# Patient Record
Sex: Male | Born: 1985 | Hispanic: Yes | Marital: Married | Smoking: Former smoker
Health system: Southern US, Community
[De-identification: ages and names within clinical notes are randomized; demographics above are authoritative.]

## PROBLEM LIST (undated history)

## (undated) DIAGNOSIS — E860 Dehydration: Secondary | ICD-10-CM

## (undated) HISTORY — PX: NO PAST SURGERIES: SHX2092

---

## 2011-09-19 ENCOUNTER — Encounter (HOSPITAL_COMMUNITY): Payer: Self-pay | Admitting: Emergency Medicine

## 2011-09-19 ENCOUNTER — Inpatient Hospital Stay (HOSPITAL_COMMUNITY)
Admission: EM | Admit: 2011-09-19 | Discharge: 2011-09-21 | DRG: 639 | Disposition: A | Discharge status: Home / Self Care | Payer: Self-pay | Attending: Internal Medicine | Admitting: Internal Medicine

## 2011-09-19 ENCOUNTER — Other Ambulatory Visit: Payer: Self-pay

## 2011-09-19 DIAGNOSIS — E119 Type 2 diabetes mellitus without complications: Secondary | ICD-10-CM | POA: Diagnosis present

## 2011-09-19 DIAGNOSIS — E872 Acidosis: Secondary | ICD-10-CM | POA: Diagnosis present

## 2011-09-19 DIAGNOSIS — E1010 Type 1 diabetes mellitus with ketoacidosis without coma: Principal | ICD-10-CM | POA: Diagnosis present

## 2011-09-19 DIAGNOSIS — E860 Dehydration: Secondary | ICD-10-CM | POA: Diagnosis present

## 2011-09-19 DIAGNOSIS — E785 Hyperlipidemia, unspecified: Secondary | ICD-10-CM | POA: Diagnosis present

## 2011-09-19 DIAGNOSIS — E1110 Type 2 diabetes mellitus with ketoacidosis without coma: Secondary | ICD-10-CM | POA: Diagnosis present

## 2011-09-19 DIAGNOSIS — E876 Hypokalemia: Secondary | ICD-10-CM | POA: Diagnosis present

## 2011-09-19 HISTORY — DX: Dehydration: E86.0

## 2011-09-19 LAB — BASIC METABOLIC PANEL
BUN: 7 mg/dL (ref 6–23)
BUN: 6 mg/dL (ref 6–23)
BUN: 8 mg/dL (ref 6–23)
CO2: 10 mEq/L — CL (ref 19–32)
CO2: 15 mEq/L — ABNORMAL LOW (ref 19–32)
CO2: 15 mEq/L — ABNORMAL LOW (ref 19–32)
CO2: 16 mEq/L — ABNORMAL LOW (ref 19–32)
Calcium: 8.4 mg/dL (ref 8.4–10.5)
Calcium: 9 mg/dL (ref 8.4–10.5)
Calcium: 9 mg/dL (ref 8.4–10.5)
Chloride: 104 mEq/L (ref 96–112)
Chloride: 113 mEq/L — ABNORMAL HIGH (ref 96–112)
Chloride: 110 mEq/L (ref 96–112)
Chloride: 110 mEq/L (ref 96–112)
Creatinine, Ser: 0.75 mg/dL (ref 0.50–1.35)
Creatinine, Ser: 0.68 mg/dL (ref 0.50–1.35)
GFR calc Af Amer: >90 mL/min (ref >90)
GFR calc Af Amer: >90 mL/min (ref >90)
GFR calc Af Amer: >90 mL/min (ref >90)
GFR calc Af Amer: >90 mL/min (ref >90)
GFR calc non Af Amer: >90 mL/min (ref >90)
GFR calc non Af Amer: >90 mL/min (ref >90)
GFR calc non Af Amer: >90 mL/min (ref >90)
GFR calc non Af Amer: >90 mL/min (ref >90)
Glucose, Bld: 334 mg/dL — ABNORMAL HIGH (ref 70–99)
Glucose, Bld: 208 mg/dL — ABNORMAL HIGH (ref 70–99)
Potassium: 3.5 mEq/L (ref 3.5–5.1)
Potassium: 3.6 mEq/L (ref 3.5–5.1)
Potassium: 3.2 mEq/L — ABNORMAL LOW (ref 3.5–5.1)
Potassium: 3 mEq/L — ABNORMAL LOW (ref 3.5–5.1)
Sodium: 143 mEq/L (ref 135–145)
Sodium: 145 mEq/L (ref 135–145)
Sodium: 146 mEq/L — ABNORMAL HIGH (ref 135–145)
Sodium: 142 mEq/L (ref 135–145)

## 2011-09-19 LAB — DIFFERENTIAL
Lymphocytes Relative: 18 % (ref 12–46)
Monocytes Absolute: 0.3 10*3/uL (ref 0.1–1.0)
Monocytes Relative: 6 % (ref 3–12)
Neutro Abs: 4.3 10*3/uL (ref 1.7–7.7)

## 2011-09-19 LAB — URINALYSIS, ROUTINE W REFLEX MICROSCOPIC
Glucose, UA: >1000 mg/dL — AB
Leukocytes, UA: NEGATIVE
Nitrite: NEGATIVE
Specific Gravity, Urine: 1.04 — ABNORMAL HIGH (ref 1.005–1.030)
pH: 5.5 (ref 5.0–8.0)

## 2011-09-19 LAB — CBC
HCT: 44.8 % (ref 39.0–52.0)
Hemoglobin: 15 g/dL (ref 13.0–17.0)
MCH: 27 pg (ref 26.0–34.0)
MCHC: 33.7 g/dL (ref 30.0–36.0)
Platelets: 274 10*3/uL (ref 150–400)
RBC: 5.08 MIL/uL (ref 4.22–5.81)
WBC: 5.7 10*3/uL (ref 4.0–10.5)
WBC: 7.7 10*3/uL (ref 4.0–10.5)

## 2011-09-19 LAB — POCT I-STAT 3, ART BLOOD GAS (G3+)
Bicarbonate: 8.6 mEq/L — ABNORMAL LOW (ref 20.0–24.0)
TCO2: 9 mmol/L (ref 0–100)
pCO2 arterial: 22.2 mmHg — ABNORMAL LOW (ref 35.0–45.0)
pH, Arterial: 7.195 — CL (ref 7.350–7.450)

## 2011-09-19 LAB — GLUCOSE, CAPILLARY
Glucose-Capillary: 343 mg/dL — ABNORMAL HIGH (ref 70–99)
Glucose-Capillary: 227 mg/dL — ABNORMAL HIGH (ref 70–99)
Glucose-Capillary: 213 mg/dL — ABNORMAL HIGH (ref 70–99)
Glucose-Capillary: 184 mg/dL — ABNORMAL HIGH (ref 70–99)
Glucose-Capillary: 251 mg/dL — ABNORMAL HIGH (ref 70–99)
Glucose-Capillary: 130 mg/dL — ABNORMAL HIGH (ref 70–99)

## 2011-09-19 LAB — URINE MICROSCOPIC-ADD ON

## 2011-09-19 LAB — MRSA PCR SCREENING: MRSA by PCR: NEGATIVE

## 2011-09-19 MED ORDER — ENOXAPARIN SODIUM 40 MG/0.4ML ~~LOC~~ SOLN
40.0000 mg | SUBCUTANEOUS | Status: DC
  Administered 2011-09-19 – 2011-09-20 (×2): 40 mg via SUBCUTANEOUS
  Filled 2011-09-19 (×3): qty 0.4

## 2011-09-19 MED ORDER — SODIUM CHLORIDE 0.9 % IV SOLN: INTRAVENOUS | Status: DC

## 2011-09-19 MED ORDER — SODIUM CHLORIDE 0.9 % IV SOLN
INTRAVENOUS | Status: DC
  Administered 2011-09-19: 12:00:00 via INTRAVENOUS

## 2011-09-19 MED ORDER — POTASSIUM CHLORIDE 10 MEQ/100ML IV SOLN
10.0000 meq | INTRAVENOUS | Status: DC
  Filled 2011-09-19: qty 100

## 2011-09-19 MED ORDER — DEXTROSE-NACL 5-0.45 % IV SOLN
INTRAVENOUS | Status: DC
  Administered 2011-09-19: 14:00:00 via INTRAVENOUS

## 2011-09-19 MED ORDER — SODIUM CHLORIDE 0.9 % IV SOLN
999.0000 mL | INTRAVENOUS | Status: DC
  Administered 2011-09-19: 999 mL via INTRAVENOUS

## 2011-09-19 MED ORDER — PNEUMOCOCCAL VAC POLYVALENT 25 MCG/0.5ML IJ INJ
0.5000 mL | INJECTION | INTRAMUSCULAR | Status: AC
  Administered 2011-09-20: 0.5 mL via INTRAMUSCULAR
  Filled 2011-09-19: qty 0.5

## 2011-09-19 MED ORDER — SODIUM CHLORIDE 0.9 % IV SOLN
INTRAVENOUS | Status: DC
  Administered 2011-09-19: 2.4 [IU]/h via INTRAVENOUS
  Filled 2011-09-19: qty 1

## 2011-09-19 MED ORDER — POTASSIUM CHLORIDE 10 MEQ/100ML IV SOLN
10.0000 meq | INTRAVENOUS | Status: AC
  Administered 2011-09-19 (×2): 10 meq via INTRAVENOUS
  Filled 2011-09-19 (×2): qty 100

## 2011-09-19 MED ORDER — DEXTROSE 50 % IV SOLN: 25.0000 mL | INTRAVENOUS | Status: DC | PRN

## 2011-09-19 MED ORDER — DEXTROSE-NACL 5-0.45 % IV SOLN
INTRAVENOUS | Status: DC
  Administered 2011-09-19 – 2011-09-20 (×2): via INTRAVENOUS

## 2011-09-19 MED ORDER — SODIUM CHLORIDE 0.9 % IV SOLN
INTRAVENOUS | Status: DC
  Administered 2011-09-19: 1.5 [IU]/h via INTRAVENOUS
  Filled 2011-09-19: qty 1

## 2011-09-19 NOTE — Progress Notes (Signed)
I have made a referral to [facility] Johney Frame so patient may obtain some sort of health resources in the community.

## 2011-09-19 NOTE — ED Provider Notes (Signed)
History     CSN: [id number]  Arrival date & time 09/19/11  0865   First MD Initiated Contact with Patient 09/19/11 [id number]      Chief Complaint  Patient presents with  . Dehydration    (Consider location/radiation/quality/duration/timing/severity/associated sxs/prior treatment) HPI History provided by pt.   Pt c/o generalized weakness and lightheadedness x 1 month.  Sx gradually worsening.  Associated w/ subjective weight loss, polyuria, polydipsia, blurred vision and unsteadiness on feet.  Denies fever, headache, paresthesias, cough, abd pain, vomiting, diarrhea or other urinary sx.  Denies head trauma.  Pt has no PMH.    History reviewed. No pertinent past medical history.  History reviewed. No pertinent past surgical history.  History reviewed. No pertinent family history.  History  Substance Use Topics  . Smoking status: Former Smoker    Quit date: 08/15/2011  . Smokeless tobacco: Not on file  . Alcohol Use: Yes     3 drinks week      Review of Systems  All other systems reviewed and are negative.    Allergies  Review of patient's allergies indicates not on file.  Home Medications  No current outpatient prescriptions on file.  BP 138/86  Pulse 97  Temp(Src) 97.7 F (36.5 C) (Oral)  Resp 18  SpO2 100%  Physical Exam  Nursing note and vitals reviewed. Constitutional: He is oriented to person, place, and time. He appears well-developed and well-nourished. No distress.  HENT:  Head: Normocephalic and atraumatic.       Dry mucous membranes  Eyes:       Normal appearance  Neck: Normal range of motion.  Cardiovascular: Normal rate and regular rhythm.   Pulmonary/Chest: Effort normal and breath sounds normal.  Abdominal: Soft. Bowel sounds are normal. He exhibits no distension and no mass. There is no tenderness. There is no rebound and no guarding.       No CVA tenderness  Musculoskeletal:       No peripheral edema or calf ttp  Neurological: He is alert  and oriented to person, place, and time.       5/5 and equal upper and lower extremity strength.  No sensory deficits.  No past pointing.  Nml gait.    Skin: Skin is warm and dry. No rash noted.  Psychiatric: He has a normal mood and affect. His behavior is normal.    ED Course  Procedures (including critical care time)   Date: 09/19/2011  Rate: 98  Rhythm: normal sinus rhythm  QRS Axis: normal  Intervals: normal  ST/T Wave abnormalities: normal  Conduction Disutrbances:none  Narrative Interpretation:   Old EKG Reviewed: none available   Labs Reviewed  URINALYSIS, ROUTINE W REFLEX MICROSCOPIC - Abnormal; Notable for the following:    Specific Gravity, Urine 1.040 (*)    Glucose, UA >1000 (*)    Hgb urine dipstick MODERATE (*)    Ketones, ur >80 (*)    Protein, ur 100 (*)    All other components within normal limits  BASIC METABOLIC PANEL - Abnormal; Notable for the following:    CO2 10 (*)    Glucose, Bld 416 (*)    All other components within normal limits  URINE MICROSCOPIC-ADD ON - Abnormal; Notable for the following:    Casts GRANULAR CAST (*)    All other components within normal limits  GLUCOSE, CAPILLARY - Abnormal; Notable for the following:    Glucose-Capillary 441 (*)    All other components within normal limits  GLUCOSE, CAPILLARY - Abnormal; Notable for the following:    Glucose-Capillary 343 (*)    All other components within normal limits  BASIC METABOLIC PANEL - Abnormal; Notable for the following:    CO2 11 (*)    Glucose, Bld 334 (*)    All other components within normal limits  GLUCOSE, CAPILLARY - Abnormal; Notable for the following:    Glucose-Capillary 302 (*)    All other components within normal limits  GLUCOSE, CAPILLARY - Abnormal; Notable for the following:    Glucose-Capillary 309 (*)    All other components within normal limits  CBC  DIFFERENTIAL  BASIC METABOLIC PANEL  BASIC METABOLIC PANEL  BASIC METABOLIC PANEL  BLOOD GAS,  ARTERIAL   No results found.   1. DKA (diabetic ketoacidoses)       MDM  Previously healthy 26yo M presents w/ generalized weakness, lightheadedness, subjective weight loss, polyuria, polydipsia and blurred vision x 1 month.  On exam, afebrile, NAD, dehydrated, lungs clear, abd benign/non-tender.  Cbg 441.  Pt receiving liter bolus and will then be placed on glucostabilizer.  Rest of labs pending.  10:19 AM   BG has improved to 343.  Labs otherwise sig for bicarb of 10 and anion gap of 28.  All results as well as plan to admit for further tmnt discussed w/ pt.  Consulted critical care since ICU currently closed and they requested [facility] admission.  [facility] has accepted pt to their service.          Arie Sabina [place], [place] 09/19/11 1319

## 2011-09-19 NOTE — ED Notes (Signed)
Pt c/o excessive thirst, urination, weighloss significant, dizziness for about the last month. Denies HA. C/o changes in gait, blurred vision.

## 2011-09-19 NOTE — H&P (Signed)
PATIENT DETAILS Name: Kyle Rubio Age: 26 y.o. Sex: male Date of Birth: [birth year] Admit Date: 09/19/2011 PCP:Pcp Not In System   CHIEF COMPLAINT:  Generalized weakness, polyuria and polydipsia for one month  HPI: Patient is a 26 year old male with no significant past medical history who presents to the ED for evaluation of the above noted complaints. The patient he was in his usual state of health around a month ago when he started developing excessive thirst and frequent urination. He claims that he has to wake up around 4 times at night to urinate. He also claims of excessive thirst and claims to drink a large amount of water. He claims that he has had a vision for the past few weeks as well. He claims to be generally weak and achy all the time. In the ED he was found to have diabetic ketoacidosis and the hospitalist service was then asked to admit him for further evaluation and treatment. Patient denies any headache, denies any fever. He denies any nausea, vomiting or diarrhea. He denies any abdominal pain or dysuria.   ALLERGIES:  No Known Allergies  PAST MEDICAL HISTORY: Past Medical History  Diagnosis Date  . Dehydration   . Diabetes mellitus     new onset    PAST SURGICAL HISTORY: Past Surgical History  Procedure Date  . No past surgeries     MEDICATIONS AT HOME: Prior to Admission medications   Not on File    FAMILY HISTORY: History reviewed. No pertinent family history.  SOCIAL HISTORY:  reports that he quit smoking about 5 weeks ago. He has never used smokeless tobacco. He reports that he drinks alcohol. He reports that he does not use illicit drugs.  REVIEW OF SYSTEMS:  Constitutional:   No  weight loss, night sweats,  Fevers, chills, fatigue.  HEENT:    No headaches, Difficulty swallowing,Tooth/dental problems,Sore throat,  No sneezing, itching, ear ache, nasal congestion, post nasal drip,   Cardio-vascular: No chest pain,  Orthopnea,  PND, swelling in lower extremities, anasarca, dizziness, palpitations  GI:  No heartburn, indigestion, abdominal pain, nausea, vomiting, diarrhea, change in       bowel habits, loss of appetite  Resp: No shortness of breath with exertion or at rest.  No excess mucus, no productive cough, No non-productive cough,  No coughing up of blood.No change in color of mucus.No wheezing.No chest wall deformity  Skin:  no rash or lesions.  GU:  no dysuria, change in color of urine, no urgency or frequency.  No flank pain.  Musculoskeletal: No joint pain or swelling.  No decreased range of motion.  No back pain.  Psych: No change in mood or affect. No depression or anxiety.  No memory loss.   PHYSICAL EXAM: Blood pressure 127/72, pulse 74, temperature 98.4 F (36.9 C), temperature source Oral, resp. rate 16, SpO2 100.00%.  General appearance :Awake, alert, not in any distress. Speech Clear. Not toxic Looking HEENT: Atraumatic and Normocephalic, pupils equally reactive to light and accomodation Neck: supple, no JVD. No cervical lymphadenopathy.  Chest:Good air entry bilaterally, no added sounds  CVS: S1 S2 regular, no murmurs.  Abdomen: Bowel sounds present, Non tender and not distended with no gaurding, rigidity or rebound. Extremities: B/L Lower Ext shows no edema, both legs are warm to touch, with  dorsalis pedis pulses palpable. Neurology: Awake alert, and oriented X 3, CN II-XII intact, Non focal, Deep Tendon Reflex-2+ all over, plantar's downgoing B/L, sensory exam is grossly intact.  Skin:No Rash  Wounds:N/A  LABS ON ADMISSION:   Basename 09/19/11 1601 09/19/11 1600  NA 146* 145  K 3.6 3.7  CL 114* 113*  CO2 15* 15*  GLUCOSE 208* 200*  BUN 6 6  CREATININE 0.71 0.71  CALCIUM 9.1 9.0  MG -- --  PHOS -- --   No results found for this basename: AST:2,ALT:2,ALKPHOS:2,BILITOT:2,PROT:2,ALBUMIN:2 in the last 72 hours No results found for this basename: LIPASE:2,AMYLASE:2 in the  last 72 hours  Basename 09/19/11 1600 09/19/11 0927  WBC 7.7 5.7  NEUTROABS -- 4.3  HGB 13.7 15.0  HCT 40.6 44.8  MCV 79.9 80.4  PLT 274 275   No results found for this basename: CKTOTAL:3,CKMB:3,CKMBINDEX:3,TROPONINI:3 in the last 72 hours No results found for this basename: DDIMER:2 in the last 72 hours No components found with this basename: POCBNP:3   RADIOLOGIC STUDIES ON ADMISSION: No results found.  ASSESSMENT AND PLAN: Present on Admission:   Diabetic ketoacidosis -We'll admit to a step down unit for now -We'll aggressively hydrate and place him on insulin infusion per glucose stabilizer protocol -He would likely need to be transitioned to subcutaneous insulin, will obtain HbA1c as well. -Patient will need extensive diabetic education and teaching prior to discharge.  Further plan will depend as patient's clinical course evolves and further radiologic and laboratory data become available. Patient will be monitored closely.  DVT Prophylaxis: Lovenox  Code Status: Full code  Total time spent for admission equals 45 minutes.  Jeoffrey Massed 09/19/2011, 6:14 PM

## 2011-09-19 NOTE — Progress Notes (Signed)
Utilization Review Completed.Remmie Bembenek T3/27/2013   

## 2011-09-19 NOTE — ED Notes (Signed)
Respiratory called regarding ABG results. States that they will call this RN back with results

## 2011-09-19 NOTE — ED Notes (Addendum)
CBG 441, Dr. Richrd Prime made aware.

## 2011-09-19 NOTE — ED Notes (Signed)
2604-01 Ready 

## 2011-09-19 NOTE — ED Provider Notes (Signed)
Medical screening examination/treatment/procedure(s) were performed by non-physician practitioner and as supervising physician I was immediately available for consultation/collaboration.   Laray Anger, DO 09/19/11 2001

## 2011-09-20 DIAGNOSIS — E860 Dehydration: Secondary | ICD-10-CM | POA: Diagnosis present

## 2011-09-20 DIAGNOSIS — E876 Hypokalemia: Secondary | ICD-10-CM | POA: Diagnosis present

## 2011-09-20 DIAGNOSIS — E872 Acidosis: Secondary | ICD-10-CM | POA: Diagnosis present

## 2011-09-20 DIAGNOSIS — E119 Type 2 diabetes mellitus without complications: Secondary | ICD-10-CM | POA: Diagnosis present

## 2011-09-20 DIAGNOSIS — E1110 Type 2 diabetes mellitus with ketoacidosis without coma: Secondary | ICD-10-CM | POA: Diagnosis present

## 2011-09-20 LAB — GLUCOSE, CAPILLARY
Glucose-Capillary: 136 mg/dL — ABNORMAL HIGH (ref 70–99)
Glucose-Capillary: 288 mg/dL — ABNORMAL HIGH (ref 70–99)
Glucose-Capillary: 287 mg/dL — ABNORMAL HIGH (ref 70–99)
Glucose-Capillary: 218 mg/dL — ABNORMAL HIGH (ref 70–99)

## 2011-09-20 LAB — BASIC METABOLIC PANEL
CO2: 17 mEq/L — ABNORMAL LOW (ref 19–32)
Calcium: 8.6 mg/dL (ref 8.4–10.5)
Chloride: 108 mEq/L (ref 96–112)
Potassium: 3.3 mEq/L — ABNORMAL LOW (ref 3.5–5.1)
Sodium: 140 mEq/L (ref 135–145)

## 2011-09-20 MED ORDER — SODIUM CHLORIDE 0.9 % IV SOLN
INTRAVENOUS | Status: DC
  Administered 2011-09-20: 1000 mL via INTRAVENOUS

## 2011-09-20 MED ORDER — LIVING WELL WITH DIABETES BOOK - IN SPANISH
Freq: Once | Status: AC
  Administered 2011-09-20: 02:00:00
  Filled 2011-09-20: qty 1

## 2011-09-20 MED ORDER — POTASSIUM CHLORIDE 10 MEQ/100ML IV SOLN
10.0000 meq | INTRAVENOUS | Status: AC
  Administered 2011-09-20 (×2): 10 meq via INTRAVENOUS
  Filled 2011-09-20 (×2): qty 100

## 2011-09-20 MED ORDER — INSULIN ASPART PROT & ASPART (70-30 MIX) 100 UNIT/ML ~~LOC~~ SUSP: 15.0000 [IU] | Freq: Every day | SUBCUTANEOUS | Status: DC

## 2011-09-20 MED ORDER — INSULIN ASPART 100 UNIT/ML ~~LOC~~ SOLN
0.0000 [IU] | Freq: Every day | SUBCUTANEOUS | Status: DC
  Administered 2011-09-20: 2 [IU] via SUBCUTANEOUS

## 2011-09-20 MED ORDER — INSULIN GLARGINE 100 UNIT/ML ~~LOC~~ SOLN
5.0000 [IU] | Freq: Every day | SUBCUTANEOUS | Status: DC
  Administered 2011-09-20: 5 [IU] via SUBCUTANEOUS

## 2011-09-20 MED ORDER — LIVING WELL WITH DIABETES BOOK - IN SPANISH
Freq: Once | Status: AC
  Administered 2011-09-20: 18:00:00
  Filled 2011-09-20: qty 1

## 2011-09-20 MED ORDER — BD GETTING STARTED TAKE HOME KIT: 1/2ML X 30G SYRINGES
1.0000 | Freq: Once | Status: AC
  Administered 2011-09-20: 1
  Filled 2011-09-20: qty 1

## 2011-09-20 MED ORDER — INSULIN ASPART 100 UNIT/ML ~~LOC~~ SOLN
0.0000 [IU] | Freq: Three times a day (TID) | SUBCUTANEOUS | Status: DC
  Administered 2011-09-20: 8 [IU] via SUBCUTANEOUS
  Administered 2011-09-21 (×2): 5 [IU] via SUBCUTANEOUS
  Administered 2011-09-21: 3 [IU] via SUBCUTANEOUS

## 2011-09-20 MED ORDER — INSULIN ASPART PROT & ASPART (70-30 MIX) 100 UNIT/ML ~~LOC~~ SUSP
15.0000 [IU] | Freq: Two times a day (BID) | SUBCUTANEOUS | Status: DC
  Administered 2011-09-20 – 2011-09-21 (×2): 15 [IU] via SUBCUTANEOUS
  Filled 2011-09-20: qty 3

## 2011-09-20 MED ORDER — INSULIN ASPART 100 UNIT/ML ~~LOC~~ SOLN
0.0000 [IU] | Freq: Three times a day (TID) | SUBCUTANEOUS | Status: DC
  Administered 2011-09-20 (×2): 5 [IU] via SUBCUTANEOUS

## 2011-09-20 NOTE — Progress Notes (Signed)
Diabetic teaching done by Spanish Interpreter with Staff nurse and Diabetic Nurse Coordinator in attendance.  Patient seems to understanding.  Wife and children came and encourage them to asked questions.  Patient also asked questions and has been clarified.  Patient also gave an insulin injection to himself.  Will encourage him to practice more.    He will be transferred to 5509 and will informed the receiving nurse.

## 2011-09-20 NOTE — Progress Notes (Signed)
09/20/11  Patient watching DM videos in Spanish.  Living Well With Diabetes booklet in Spanish has been ordered.  Patient will need Insulin starter kit, dietician consult, DM Mos[place] notes to be given to patient in [place]. Staff nurse and Diabetes Coordinator to work with interpreter to teach patient survival skills. Would benefit from insulin regimen with 70/30 mix or NPH and Regular insulin due to affordability.  70/30 regimen would be the easiest to start: 70/30 15 units twice a day before breakfast and before supper. This dosage calculation is based on 0.4 units/kg.  If using NPH and Regular, would recommend NPH 10 units and Regular 5 units before breakfast and before supper. Will order dietician consult and insulin starter kit. Will continue to follow while in hospital.  Smith Mince RN BSN

## 2011-09-20 NOTE — Discharge Instructions (Signed)
Monitoreo de azcar en la sangre, adulto (Blood Sugar Monitoring, Adult) MEDIDORES DE GLUCOSA PARA EL AUTOCONTROL DE LA GLUCOSA EN SANGRE Para toda persona diabtica es importante poder medir correctamente su nivel de glucosa en sangre. Puede usar un glucmetro (un pequeo dispositivo operado a batera) para controlar su nivel de glucosa en cualquier momento. Esto le permite usted y a su mdico controlar su diabetes y determinar la efectividad del plan de tratamiento. El proceso de medicin de la propia glucosa en sangre con un medidor se denomina autocontrol de la glucosa en sangre. Cuando las personas diabticas se controlan el azcar en sangre (glucosa), su salud mejora. Para controlar la glucosa con un medidor clsico, coloque una tira reactiva desechable. Luego coloque una gota de sangre en la tira reactiva. Coloque la tira en el medidor. Las tiras de prueba estn cubiertas con qumicos que se combinan con la glucosa de la sangre. El medidor indica la cantidad actual de glucosa. Este medidor muestra el nivel de glucosa en nmeros. Varios modelos nuevos pueden grabar y almacenar cierta cantidad de resultados de las pruebas. Algunos modelos pueden conectarse a ordenadores personales para almacenar los resultados de las pruebas o imprimirlas.  Los nuevos glucmetros con frecuencia son ms fciles de usar que los ms antiguos. Algunos aparatos permiten extraer sangre de otras zonas adems de la yema del dedo. Algunos modelos nuevos poseen temporizador automtico, cdigos de error, lectores por seales o barra de cdigos para ayudar al ajuste correcto (calibracin). Otros tienen una pantalla grande o instrucciones habladas para las personas con dficits visuales.  INSTRUCCIONES PARA EL USO DEL MEDIDOR DE GLUCOSA  Lvese en las manos con agua y jabn o limpie la zonas con alcohol. Seque bien sus manos.   Pinche un lado de la yema del dedo con una lanceta (un pequeo instrumento manual con punta afilada).     Baje la mano y sostenga el dedo hasta que aparezca una pequea gota de sangre. Coloque la sangre en la tirilla.   Siga las instrucciones para insertar la tirilla y usar el medidor. El medidor debe encenderse y luego hay que insertar la tira reactiva antes de aplicar la muestra de sangre.   Anote el resultado de la prueba.   Debe leer cuidadosamente las indicaciones tanto para el uso del medidor como de las tirillas. Las instrucciones para el uso del medidor se encuentran en el manual del usuario. Conserve el manual ya que podr ayudarlo a solucionar algunos problemas que puedan surgir. Muchos medidores usan "cdigos de error" cuando hay un problema con el aparato, la tirilla o la muestra de sangre. Necesitar el manual para interpretar estos cdigos de error y solucionar el problema.   Nuevos dispositivos estn a la venta, como lancetas lser y medidores que pueden realizar la prueba de sangre tomndola de "sitios alternativos" del organismo, diferentes de los dedos. Sin embargo utilice pruebas estndar si su nivel de glucosa cambia rpidamente. Tambin use una prueba estndar si:   Ha comido, ha practicado actividad fsica o ha tomado insulina en las ltimas 2 horas.   Piensa que su nivel de glucosa es bajo.   Tiene tendencia a no sentir los sntomas de bajo nivel de glucosa (hipoglucemia).   Est enfermo o bajo una situacin de estrs.   Limpie el glucmetro segn las indicaciones del fabricante.   Pruebe el glucmetro segn las indicaciones del fabricante.   Lleve el medidor en su visita al consultorio del mdico. De este modo podr probar el medidor de glucosa   mientras el profesional verifica su tcnica para efectuar la medicin y asegurarse que est utilizando el medidor correctamente. El mdico tambin puede tomar una muestra de sangre usando un mtodo de laboratorio de rutina. Si los valores del medidor de glucosa coinciden con los del laboratorio, usted y el profesional podrn  comprobar que el medidor funciona bien y que est usando la tcnica correcta. El mdico indicar qu hacer si los resultados no coinciden.  FRECUENCIA DE LA PRUEBA El mdico le aconsejara con qu frecuencia debe controlar su nivel de glucosa en sangre. Esto depender de su tipo de diabetes, como su nivel actual de control de diabetes y el tipo de medicamento que toma. A continuacin se indican pautas generales, pero su plan personal puede ser diferente. Registre todos los valores y el momento del da para que su mdico pueda revisarlos.   Diabetes tipo 1.   Cuando usa insulina con un buen control de la diabetes (ya sea con mltiples inyecciones diarias o a travs de una bomba), debe controlar su nivel de glucosa 4 veces por da.   Si la diabetes no puede controlarse adecuadamente, ser necesario un control ms frecuente, por ejemplo antes de las comidas y dos horas despus de las mismas, en el momento de irse a dormir y ocasionalmente las 2 y las 3 a.m.   Debe controlar siempre su nivel de glucosa antes de recibir una dosis de insulina o antes de modificar la proporcin en la bomba de insulina.   Diabetes tipo 2.   Las pautas para el auto control del nivel de glucosa en sangre en la diabetes tipo 2 no estn bien definidas.   Si recibe insulina, siga las pautas ya indicadas.   Si toma medicamentos pero no recibe insulina, y su nivel de glucosa no est bien controlado, debe verificarlos al menos dos veces por da.   Si no recibe insulina y su diabetes est controlada slo con medicamentos o dieta, debe verificar el nivel de glucosa en sangre al menos una vez por da, generalmente antes del desayuno.   Un perfil semanal podr ser de utilidad para que su mdico lo aconseje acerca de su plan de cuidados. La semana anterior a su visita, controle su glucosa antes de las comidas, y 2 horas despus de las mismas diariamente. Puede realizar la prueba antes y despus de las distintas comidas de cada da  para que usted y el mdico puedan ver si los niveles de azcar en sangre estn controlados en un perodo de 24 horas.   Diabetes gestacional.   Es necesario repetir la prueba con frecuencia. Es importante la precisin en el momento en que la realiza.   Si no recibe insulina, controle su nivel de glucosa 4 veces por da. antes del desayuno y 1 hora despus del inicio de cada comida.   Si utiliza insulina, controle su nivel de glucosa 6 veces por da. antes del desayuno y 1 hora despus del inicio de cada comida.   Normativas generales.   En el inicio de un tratamiento es necesario realizar controles ms frecuentes. El mdico lo asesorar.   Mida su nivel de glucosa en cada momento en que sospeche que tiene un bajo nivel de azcar en sangre (hipoglucemia).   Deber controlarse con ms frecuencia cuando cambie los medicamentos, cuando pase por alguna situacin de estrs que no sea habitual, en caso de enfermedad, o en otras circunstancias poco frecuentes.  OTRAS COSAS QUE DEBE SABER ACERCA DE LOS GLUCMETROS  Amplitud de medida:   La mayor parte de los medidores de glucosa pueden mostrar niveles comprendidos en un amplio margen de valores, desde cifras tan bajas como 0, hasta niveles tan elevados como 600 mg/dL. Si el medidor le indica niveles muy elevados o muy bajos, deber confirmarlos con otra medicin. Informe a su mdico cuando los valores sean demasiado elevados o demasiado bajos.   Niveles de glucosa en sangre completa vs. niveles de glucosa en plasma: Algunos dispositivos caseros antiguos medan la glucosa en toda la sangre. Cuando se hace en el laboratorio, o con algunos medidores nuevos, la glucosa se mide en el plasma (un componente de la sangre). La diferencia puede ser importante. Es importante que usted y el profesional que lo asiste sepan si su medidor proporciona estos resultados como "equivalente de sangre completa" o "equivalente de plasma".   Visualizacin de niveles elevados y  bajos de glucosa: Parte del aprendizaje del manejo del medidor es el conocimiento del significado de los resultados. Asegrese de conocer las concentraciones ms elevadas y ms bajas que el medidor indica.   Factores que afectan el rendimiento del glucmetro. La precisin de los resultados de las pruebas dependen de muchos factores y varan segn la marca y el tipo de medidor. Aqu se incluyen:   Bajo recuento de glbulos rojos (anemia).   Sustancias presentes en la sangre (cido rico, vitamina C y otros).   Factores ambientales (temperatura, humedad, altitud).   Tiras reactivas de marca versus tirar reactivas genricas.   Calibracin. Asegrese que su glucmetro est correctamente calibrado. Es una buena idea hacer una prueba de calibracin con la solucin de control recomendada por el fabricante, cada vez que comience a usar un envase nuevo de tiras reactivas. Esto ayuda a verificar la precisin del medidor.   Tiras reactivas mal almacenadas, vencidas o defectuosas. Mantenga las tiras reactivas en un lugar seco y bien tapadas.   Medidor daado.   Muestra de sangre inadecuada.  NUEVAS TECNOLOGAS PARA LA MEDICIN DE LA GLUCOSA Lugares alternativos para tomar la muestra Algunos glucmetros permiten tomar muestras en sitios alternativos. Por ejemplo:  Brazo.   Antebrazo.   Base del pulgar.   Muslos.  Puede ser necesario tomar muestras en sitios alternativos. Sin embargo, esto puede tener algunas limitaciones. La sangre que se obtiene de la yema de los dedos puede mostrar modificaciones en los niveles de glucosa ms rpidamente que la sangre que proviene de otras partes del organismo. Esto significa que los resultados de las pruebas de lugares alternativos pueden ser diferentes de los de la yema del dedo debido a que la concentracin presente de glucosa puede ser diferente y no por la capacidad del medidor para realizar la prueba con precisin.  Control continuo de la glucosa en  sangre Ya se dispone de algunos dispositivos para medir de manera continua la glucosa en sangre y otros estn en desarrollo. Estos mtodos pueden ser ms caros que el autocontrol con un glucmetro. Sin embargo, su efectividad y confiabilidad es incierta . El mdico le aconsejar acerca de adoptar o no este mtodo. CONTROLNDO SU NIVEL DE GLUCOSA EN SANGRE, LAS PERSONAS DIABTICAS TENDRN MS SALUD  El autocontrol de la glucosa es una parte importante dentro del plan de tratamiento de los pacientes que sufren diabetes mellitus. A continuacin se indican algunos motivos para realizar el autocontrol de glucosa en sangre:   Puede confirmar que su nivel de glucosa reencuentra en un nivel especfico, saludable.   Detecta hipoglucemia e hiperglucemia grave.   Le permite a usted y al mdico   hacer ajustes en respuesta a los cambios en el estilo de vida en aquellas personas que requieren medicamentos.   Determina la necesidad de comenzar una terapia con insulina en la diabetes pasajera que se produce durante el embarazo (diabetes mellitus gestacional).  Document Released: 06/11/2005 Document Revised: 05/31/2011 [employer] Patient Information 2012 [employer]. 

## 2011-09-20 NOTE — Plan of Care (Signed)
Problem: Phase I Progression Outcomes Goal: Initial discharge plan identified Outcome: Completed/Met Date Met:  09/20/11 To return home with wife and children

## 2011-09-20 NOTE — Progress Notes (Signed)
26 yo male transferred to 5509 from 2600 after being admitted on 3/27 with DKA & new onset DM.  Pt. A & O x 4, spanish speaking, ambulatory, wife and children at bedside.  IV to right AC with NS infusing at 50 cc/hr and left AC NSL both dated for 3/27.  Skin intact,  oriented to floor and unit.  Placed on telemetry.  Plan of care to get CBG under 200 and DM education.  Will continue to monitor and assist with d/c planning as pt. Progresses.  Forbes Cellar, RN

## 2011-09-20 NOTE — Progress Notes (Signed)
09/20/11 Spoke with patient and wife with a Spanish interpreter present.  Father and Mother both have diabetes. (type of diabetes unknown) Patient was instructed by staff nurse on drawing up Novolog insulin for the lunch correction dose.  Patient gave injection in Left abdomen.  Did well.  Explained to patient the importance of taking insulin everyday.  Hypoglycemia and hyperglycemia with symptoms and treatment were discussed.  Wife asked interpreter to tell him about the complications of diabetes and that he should take it seriously.  Discussed foot care. Mentioned that exercise was an important part of taking care of his diabetes.  Discussed blood sugar monintoring and that he needed to check blood sugars 2-3 times per day.  Told where to get meter, strips, lancets, etc.  Will be speaking with dietician about meal plan.  Will need a primary care physician on discharge.  Given Diabetes [facility] notes on all these topics.  Will continue to follow while in hospital.  Smith Mince RN BSN

## 2011-09-20 NOTE — Progress Notes (Signed)
Inpatient Diabetes Program Recommendations  AACE/ADA: New Consensus Statement on Inpatient Glycemic Control (2009)  Target Ranges:  Prepandial:   less than 140 mg/dL      Peak postprandial:   less than 180 mg/dL (1-2 hours)      Critically ill patients:  140 - 180 mg/dL   Reason for Visit: New onset diabetes; patient without insurance  Inpatient Diabetes Program Recommendations Insulin - Basal: Change basal insulin to 70/30 15 units at breakfast and supper.    Note: This dosage calculation is equal to 0.4 units/kg.  This would probably be the easiest regimen for patient until he is able to see a PCP. 70/30 regimen or NPH and Regular insulin regimen would be more affordable.

## 2011-09-20 NOTE — Progress Notes (Signed)
[facility]   Subjective: Alert and appears overwhelmed with no diagnoses. Patient unable to converse in [place]. His significant other is at the bedside and she is assisting with translating. My attending physician is also speaking mix of English and Spanish which the patient is understanding. He does not have insurance and does not have a primary care physician.  Objective: Vital signs in last 24 hours: Temp:  [97.5 F (36.4 C)-98.4 F (36.9 C)] 98.1 F (36.7 C) (03/28 0700) Pulse Rate:  [60-80] 67  (03/28 0700) Resp:  [13-21] 16  (03/28 0700) BP: (112-150)/(63-96) 121/76 mmHg (03/28 0700) SpO2:  [98 %-100 %] 100 % (03/28 0700) Weight:  [79.4 kg (175 lb 0.7 oz)] 79.4 kg (175 lb 0.7 oz) (03/28 0009) Weight change:     Intake/Output from previous day: 03/27 0701 - 03/28 0700 In: 2287.9 [P.O.:480; I.V.:1507.9; IV Piggyback:300] Out: -  Intake/Output this shift: Total I/O In: 90 [I.V.:90] Out: -   General appearance: alert, cooperative, appears stated age and no distress Resp: clear to auscultation bilaterally Cardio: regular rate and rhythm, S1, S2 normal, no murmur, click, rub or gallop GI: soft, non-tender; bowel sounds normal; no masses,  no organomegaly Extremities: extremities normal, atraumatic, no cyanosis or edema Neurologic: Grossly normal  Lab Results:  Basename 09/19/11 1600 09/19/11 0927  WBC 7.7 5.7  HGB 13.7 15.0  HCT 40.6 44.8  PLT 274 275   BMET  Basename 09/20/11 0300 09/19/11 2145  NA 140 141  K 3.3* 3.0*  CL 108 110  CO2 17* 16*  GLUCOSE 246* 147*  BUN 11 9  CREATININE 0.68 0.68  CALCIUM 8.6 9.2    Studies/Results: No results found.  Medications:  I have reviewed the patient's current medications. Scheduled:   . bd getting started take home kit  1 kit Other Once  . enoxaparin  40 mg Subcutaneous Q24H  . insulin aspart  0-5 Units Subcutaneous QHS  . insulin aspart  0-9 Units Subcutaneous TID WC  . insulin aspart  protamine-insulin aspart  15 Units Subcutaneous Q breakfast   And  . insulin aspart protamine-insulin aspart  15 Units Subcutaneous Q lunch   And  . insulin aspart protamine-insulin aspart  15 Units Subcutaneous Q supper  . living well with diabetes book- in spanish   Does not apply Once  . pneumococcal 23 valent vaccine  0.5 mL Intramuscular Tomorrow-1000  . potassium chloride  10 mEq Intravenous Q1H  . potassium chloride  10 mEq Intravenous Q1H  . DISCONTD: insulin glargine  5 Units Subcutaneous QHS  . DISCONTD: potassium chloride  10 mEq Intravenous Q1H    Assessment/Plan:  Principal Problem:  *DKA (diabetic ketoacidoses) *Resolved *Has been transitioned off of IV insulin to injectable subcutaneous insulin  Active Problems:  Newly diagnosed diabetes *Patient has family history - both parents with diabetes *Unclear as if this is type I or type 2 diabetes therefore we'll check a C-peptide level *Hemoglobin A1c 11.7 *May require insulin when ready for discharge home but if the C-peptide level is normal consider transitioning to metformin *At the recommendation of the diabetes educator we have changed the Lantus insulin to 70/30 insulin since this will be least expensive for this patient *Diabetic education and Spanish has been provided to the patient including resources to take home. Dietician consultation is also pending. *Also check a fasting lipid panel in the morning.   Dehydration/Metabolic acidosis/Hypokalemia *All secondary to DKA process *Continue IV fluid hydration *Acidosis has resolve *Check electrolyte panel in  the morning   Disposition *Transfer to 5500    LOS: 1 day   Junious Silk, ANP pager [phone]  [facility] [email or web address] Password: [email or web address]  09/20/2011, 11:35 AM  I have examined the patient and reviewed the chart and I agree with the above note.   Calvert Cantor, MD [phone]

## 2011-09-20 NOTE — Progress Notes (Signed)
Nutrition Brief Note:  RD consulted for DM education. Pt was in the process of being moved to 5500 unit when RD visited. RD gave Carbohydrate counting nutrition information (in Spanish) to wife. RD will follow up with patient to further explain diet and food choices once pt is in new room and settled.  Clarene Duke MARIE

## 2011-09-20 NOTE — Progress Notes (Signed)
09/20/11 NSG 1900 Pt. Given Living Well with Diabetes booklet, Blood Sugar Monitoring and How & Where to give insulin injections handouts all in spanish.  Used Spanish interpretor [phone] to explain booklet and handouts.  Was also going to instruct pt. On how to draw up insulin, pt. Stated he was instructed this am and drew up and gave 15 units of 70/30 insulin.  Will continue to monitor.  Forbes Cellar, RN

## 2011-09-21 LAB — CBC
HCT: 37.4 % — ABNORMAL LOW (ref 39.0–52.0)
Hemoglobin: 12.9 g/dL — ABNORMAL LOW (ref 13.0–17.0)
MCH: 27.2 pg (ref 26.0–34.0)
MCV: 78.9 fL (ref 78.0–100.0)
RBC: 4.74 MIL/uL (ref 4.22–5.81)

## 2011-09-21 LAB — BASIC METABOLIC PANEL
BUN: 8 mg/dL (ref 6–23)
CO2: 18 mEq/L — ABNORMAL LOW (ref 19–32)
Chloride: 103 mEq/L (ref 96–112)
Chloride: 104 mEq/L (ref 96–112)
GFR calc Af Amer: >90 mL/min (ref >90)
GFR calc non Af Amer: >90 mL/min (ref >90)
Glucose, Bld: 247 mg/dL — ABNORMAL HIGH (ref 70–99)
Potassium: 2.7 mEq/L — CL (ref 3.5–5.1)
Potassium: 3.5 mEq/L (ref 3.5–5.1)
Sodium: 138 mEq/L (ref 135–145)

## 2011-09-21 LAB — GLUCOSE, CAPILLARY: Glucose-Capillary: 197 mg/dL — ABNORMAL HIGH (ref 70–99)

## 2011-09-21 LAB — LIPID PANEL
HDL: 39 mg/dL — ABNORMAL LOW (ref >39)
Total CHOL/HDL Ratio: 5 RATIO
VLDL: 35 mg/dL (ref 0–40)

## 2011-09-21 MED ORDER — POTASSIUM CHLORIDE 10 MEQ/100ML IV SOLN
10.0000 meq | INTRAVENOUS | Status: AC
  Administered 2011-09-21 (×4): 10 meq via INTRAVENOUS
  Filled 2011-09-21 (×4): qty 100

## 2011-09-21 MED ORDER — SIMVASTATIN 5 MG PO TABS: 5.0000 mg | ORAL_TABLET | Freq: Every day | ORAL | Status: AC

## 2011-09-21 MED ORDER — INSULIN ASPART PROT & ASPART (70-30 MIX) 100 UNIT/ML ~~LOC~~ SUSP: 18.0000 [IU] | Freq: Two times a day (BID) | SUBCUTANEOUS | Status: DC

## 2011-09-21 NOTE — Discharge Summary (Signed)
PATIENT DETAILS Name: Kyle Rubio Age: 26 y.o. Sex: male Date of Birth: [birth year] MRN: [id number]. Admit Date: 09/19/2011 Admitting Physician: Maretta Bees, MD PCP:Pcp Not In System  PRIMARY DISCHARGE DIAGNOSIS:  Principal Problem:  *DKA (diabetic ketoacidoses) Active Problems:  Newly diagnosed diabetes  Dehydration  Metabolic acidosis  Hypokalemia   Dyslipidemia      PAST MEDICAL HISTORY: Past Medical History  Diagnosis Date  . Dehydration   . Diabetes mellitus     new onset    DISCHARGE MEDICATIONS: Medication List  As of 09/21/2011  3:41 PM   TAKE these medications         insulin aspart protamine-insulin aspart (70-30) 100 UNIT/ML injection   Commonly known as: NOVOLOG 70/30   Inject 18 Units into the skin 2 (two) times daily with a meal.      simvastatin 5 MG tablet   Commonly known as: ZOCOR   Take 1 tablet (5 mg total) by mouth at bedtime.             BRIEF HPI:  See H&P, Labs, Consult and Test reports for all details in brief, patient was admitted for polyuria and polydipsia and elevated blood sugars.He was found to have DKA  CONSULTATIONS:   None  PERTINENT RADIOLOGIC STUDIES: No results found.   PERTINENT LAB RESULTS: CBC:  Basename 09/21/11 0530 09/19/11 1600  WBC 7.2 7.7  HGB 12.9* 13.7  HCT 37.4* 40.6  PLT 236 274   CMET CMP     Component Value Date/Time   NA 137 09/21/2011 1352   K 3.5 09/21/2011 1352   CL 104 09/21/2011 1352   CO2 19 09/21/2011 1352   GLUCOSE 289* 09/21/2011 1352   BUN 8 09/21/2011 1352   CREATININE 0.55 09/21/2011 1352   CALCIUM 8.5 09/21/2011 1352   GFRNONAA >90 09/21/2011 1352   GFRAA >90 09/21/2011 1352    GFR Estimated Creatinine Clearance: 137.3 ml/min (by C-G formula based on Cr of 0.55). No results found for this basename: LIPASE:2,AMYLASE:2 in the last 72 hours No results found for this basename: CKTOTAL:3,CKMB:3,CKMBINDEX:3,TROPONINI:3 in the last 72 hours No components found with  this basename: POCBNP:3 No results found for this basename: DDIMER:2 in the last 72 hours  Basename 09/19/11 2006  HGBA1C 11.7*    Basename 09/21/11 0530  CHOL 195  HDL 39*  LDLCALC 121*  TRIG 173*  CHOLHDL 5.0  LDLDIRECT --   No results found for this basename: TSH,T4TOTAL,FREET3,T3FREE,THYROIDAB in the last 72 hours No results found for this basename: VITAMINB12:2,FOLATE:2,FERRITIN:2,TIBC:2,IRON:2,RETICCTPCT:2 in the last 72 hours Coags: No results found for this basename: PT:2,INR:2 in the last 72 hours Microbiology: Recent Results (from the past 240 hour(s))  MRSA PCR SCREENING     Status: Normal   Collection Time   09/19/11  4:07 PM      Component Value Range Status Comment   MRSA by PCR NEGATIVE  NEGATIVE  Final      BRIEF HOSPITAL COURSE:   Principal Problem:  *DKA (diabetic ketoacidoses) -patient presented with elevated blood sugars along wit polyuria and polydipsia, he was found to have DKA as well. He was started on Insulin drip per DKA protocol and initially admitted to the Step down unit. He was then transitioned to SQ insulin, extensive diabetic education has been provided, and currently patient is very anxious to be discharged home. He has met maximal benefit from his inpatient hospital stay, and is deemed stable to be discharged home today.His discharge regimen of insulin is  noted as above   Dehydration -resolved with IV Fluids   Metabolic acidosis -secondary to DKA-Resolved   Hypokalemia  -repleted  Dyslipidemia -statin  TODAY-DAY OF DISCHARGE:  Subjective:   Kyle Rubio today has no headache,no chest abdominal pain,no new weakness tingling or numbness, feels much better wants to go home today.   Objective:   Blood pressure 134/76, pulse 76, temperature 98.5 F (36.9 C), temperature source Oral, resp. rate 18, height 5\' 6"  (1.676 m), weight 79.4 kg (175 lb 0.7 oz), SpO2 97.00%.  Intake/Output Summary (Last 24 hours) at 09/21/11  1541 Last data filed at 09/21/11 1300  Gross per 24 hour  Intake    720 ml  Output    400 ml  Net    320 ml    Exam Awake Alert, Oriented *3, No new F.N deficits, Normal affect [place].AT,PERRAL Supple Neck,No JVD, No cervical lymphadenopathy appriciated.  Symmetrical Chest wall movement, Good air movement bilaterally, CTAB RRR,No Gallops,Rubs or new Murmurs, No Parasternal Heave +ve B.Sounds, Abd Soft, Non tender, No organomegaly appriciated, No rebound -guarding or rigidity. No Cyanosis, Clubbing or edema, No new Rash or bruise  DISPOSITION: Home   DISCHARGE INSTRUCTIONS:    Follow-up Information    Follow up with [facility] on 09/27/2011. (appointment- 1:00 pm (know you may have to wait to see MD even with appointment time))    Contact information:   [address]  [address] [place] Phone:  [phone]          Total Time spent on discharge equals 45 minutes.  SignedJeoffrey Massed 09/21/2011 3:41 PM

## 2011-09-21 NOTE — Progress Notes (Signed)
D/c instructions reviewed with pt and family with an interpreter in the room to assist with instructions and aide in pt questions. Pt and family verbalized understanding of all instructions and care, insulins, monitoring blood sugars, MD follow up appointment. Pt allow to ask any questions, all questions answered. Education was provided. Pt given a copy of instructions, scripts, his bottle of 70/30 insulin and his starter kit. Pt d/c'd via ambulation, declined a wheelchair when offered. Pt left with all his belongings with his family.

## 2011-09-21 NOTE — Progress Notes (Signed)
   CARE MANAGEMENT NOTE 09/21/2011  Patient:  Kyle Rubio, Kyle Rubio   Account Number:  [id number]  Date Initiated:  09/21/2011  Documentation initiated by:  Donn Pierini  Subjective/Objective Assessment:   Pt admitted with DKA     Action/Plan:   PTA pt lived at home with family, independent with ADLs- does not speak english   Anticipated DC Date:  09/21/2011   Anticipated DC Plan:  HOME/SELF CARE      DC Planning Services  CM consult  Follow-up appt scheduled      Choice offered to / List presented to:             Status of service:  Completed, signed off Medicare Important Message given?   (If response is "NO", the following Medicare IM given date fields will be blank) Date Medicare IM given:   Date Additional Medicare IM given:    Discharge Disposition:  HOME/SELF CARE  Per UR Regulation:  Reviewed for med. necessity/level of care/duration of stay  If discussed at Long Length of Stay Meetings, dates discussed:    Comments:  09/21/11- 1600- Donn Pierini RN, BSN [phone] Spoke with pt with help from interpreter Raynelle Fanning) information given to pt on f/u appointment made with [facility] for April 4 at 1 pm- address and phone # given to pt along with info regarding $45 payment needed to see doctor. Info alos given where to go to purchase glucometer. Questions answered via interpreter.

## 2011-09-21 NOTE — Plan of Care (Signed)
Problem: Food- and Nutrition-Related Knowledge Deficit (NB-1.1) Goal: Nutrition education Formal process to instruct or train a patient/client in a skill or to impart knowledge to help patients/clients voluntarily manage or modify food choices and eating behavior to maintain or improve health.  Outcome: Completed/Met Date Met:  09/21/11 Knowledge deficit resolved with spanish diabetes diet education on 3/29.   Comments:  Discussed and provided handout obtained from ADA Nutrition Care Manual, "Nutrition Therapy for type 2 Diabetes" -Spanish version. Reviewed the diet material. Patient verbalized understanding. He stated he watched the videos yesterday for diabetes. Patient without any nutrition related questions. Expect good compliance.   RD to follow for nutrition needs.  Iven Finn [facility] [phone]

## 2011-09-21 NOTE — Progress Notes (Signed)
CRITICAL VALUE ALERT  Critical value received:  Potassium 2.7  Date of notification:  09/21/11  Time of notification:  0721   Critical value read back:yes  Nurse who received alert:  Theador Hawthorne  MD notified (1st page):  Ghimire  Time of first page:  0725  MD notified (2nd page):  Time of second page:  Responding MD:  Jerral Ralph  Time MD responded:  [phone]

## 2015-11-11 LAB — GLUCOSE, POCT (MANUAL RESULT ENTRY): POC Glucose: 199 mg/dl — AB (ref 70–99)

## 2018-05-29 ENCOUNTER — Emergency Department (HOSPITAL_COMMUNITY)
Admission: EM | Admit: 2018-05-29 | Discharge: 2018-05-29 | Disposition: A | Discharge status: Home / Self Care | Payer: Self-pay | Attending: Emergency Medicine | Admitting: Emergency Medicine

## 2018-05-29 ENCOUNTER — Emergency Department (HOSPITAL_COMMUNITY): Payer: Self-pay

## 2018-05-29 DIAGNOSIS — E119 Type 2 diabetes mellitus without complications: Secondary | ICD-10-CM | POA: Insufficient documentation

## 2018-05-29 DIAGNOSIS — Z794 Long term (current) use of insulin: Secondary | ICD-10-CM | POA: Insufficient documentation

## 2018-05-29 DIAGNOSIS — R079 Chest pain, unspecified: Secondary | ICD-10-CM | POA: Insufficient documentation

## 2018-05-29 DIAGNOSIS — Z79899 Other long term (current) drug therapy: Secondary | ICD-10-CM | POA: Insufficient documentation

## 2018-05-29 DIAGNOSIS — Z87891 Personal history of nicotine dependence: Secondary | ICD-10-CM | POA: Insufficient documentation

## 2018-05-29 LAB — CBC
HCT: 42.9 % (ref 39.0–52.0)
Hemoglobin: 13.9 g/dL (ref 13.0–17.0)
MCH: 27.3 pg (ref 26.0–34.0)
MCHC: 32.4 g/dL (ref 30.0–36.0)
MCV: 84.1 fL (ref 80.0–100.0)
NRBC: 0 % (ref 0.0–0.2)
PLATELETS: 301 10*3/uL (ref 150–400)
RBC: 5.1 MIL/uL (ref 4.22–5.81)
RDW: 11.6 % (ref 11.5–15.5)
WBC: 6.4 10*3/uL (ref 4.0–10.5)

## 2018-05-29 LAB — BASIC METABOLIC PANEL
ANION GAP: 11 (ref 5–15)
BUN: 14 mg/dL (ref 6–20)
CALCIUM: 9.2 mg/dL (ref 8.9–10.3)
CO2: 25 mmol/L (ref 22–32)
Chloride: 98 mmol/L (ref 98–111)
Creatinine, Ser: 0.57 mg/dL — ABNORMAL LOW (ref 0.61–1.24)
GFR calc non Af Amer: >60 mL/min (ref >60)
Glucose, Bld: 303 mg/dL — ABNORMAL HIGH (ref 70–99)
Potassium: 3.9 mmol/L (ref 3.5–5.1)
SODIUM: 134 mmol/L — AB (ref 135–145)

## 2018-05-29 LAB — I-STAT TROPONIN, ED
Troponin i, poc: 0 ng/mL (ref 0.00–0.08)
Troponin i, poc: 0 ng/mL (ref 0.00–0.08)

## 2018-05-29 LAB — D-DIMER, QUANTITATIVE (NOT AT ARMC): D-Dimer, Quant: <0.27 ug/mL-FEU (ref 0.00–0.50)

## 2018-05-29 MED ORDER — IBUPROFEN 400 MG PO TABS
600.0000 mg | ORAL_TABLET | Freq: Once | ORAL | Status: AC
  Administered 2018-05-29: 600 mg via ORAL
  Filled 2018-05-29: qty 1

## 2018-05-29 MED ORDER — IBUPROFEN 600 MG PO TABS: 600.0000 mg | ORAL_TABLET | Freq: Four times a day (QID) | ORAL | 0 refills | Status: DC | PRN

## 2018-05-29 NOTE — ED Provider Notes (Signed)
[facility] Provider Note   CSN: [id number] Arrival date & time: 05/29/18  1804     History   Chief Complaint Chief Complaint  Patient presents with  . Chest Pain    HPI Kyle LandauGustavo Gonzalez-Almendarez is a 32 y.o. male with history of diabetes who presents with a one-week history of left-sided chest pain.  It is worse when he takes a deep breath.  He describes it as a load or heaviness on his chest.  It is also little worse when he leans to the left side.  He denies any associated shortness of breath, diaphoresis, nausea, vomiting.  He denies any drug use, specifically cocaine.  He drinks alcohol socially.  He also denies any abdominal pain.  He denies any recent heavy lifting.  He works in [employer].  He denies any recent long trips, surgeries, new leg pain or swelling, known cancer, history of blood clots.  The history is provided by the patient. The history is limited by a language barrier. A language interpreter was used.    Past Medical History:  Diagnosis Date  . Dehydration   . Diabetes mellitus    new onset    Patient Active Problem List   Diagnosis Date Noted  . Newly diagnosed diabetes (HCC) 09/20/2011  . Dehydration 09/20/2011  . Metabolic acidosis 09/20/2011  . DKA (diabetic ketoacidoses) (HCC) 09/20/2011  . Hypokalemia 09/20/2011    Past Surgical History:  Procedure Laterality Date  . NO PAST SURGERIES          Home Medications    Prior to Admission medications   Medication Sig Start Date End Date Taking? Authorizing Provider  ibuprofen (ADVIL,MOTRIN) 600 MG tablet Take 1 tablet (600 mg total) by mouth every 6 (six) hours as needed. 05/29/18   Francoise Chojnowski, Waylan BogaAlexandra M, PA-C  insulin aspart protamine-insulin aspart (NOVOLOG 70/30) (70-30) 100 UNIT/ML injection Inject 18 Units into the skin 2 (two) times daily with a meal. 09/21/11 09/20/12  Ghimire, Werner LeanShanker M, MD  simvastatin (ZOCOR) 5 MG tablet Take 1 tablet (5 mg total) by  mouth at bedtime. 09/21/11 09/20/12  Maretta BeesGhimire, Shanker M, MD    Family History No family history on file.  Social History Social History   Tobacco Use  . Smoking status: Former Smoker    Last attempt to quit: 08/15/2011    Years since quitting: 6.7  . Smokeless tobacco: Never Used  Substance Use Topics  . Alcohol use: Yes    Comment: 3 drinks week  . Drug use: No     Allergies   Patient has no known allergies.   Review of Systems Review of Systems  Constitutional: Negative for chills and fever.  HENT: Negative for facial swelling and sore throat.   Respiratory: Negative for cough and shortness of breath.   Cardiovascular: Positive for chest pain. Negative for leg swelling.  Gastrointestinal: Negative for abdominal pain, nausea and vomiting.  Genitourinary: Negative for dysuria.  Musculoskeletal: Negative for back pain.  Skin: Negative for rash and wound.  Neurological: Negative for headaches.  Psychiatric/Behavioral: The patient is not nervous/anxious.      Physical Exam Updated Vital Signs BP 119/63 (BP Location: Right Arm)   Pulse 72   Temp 98 F (36.7 C) (Oral)   Resp 14   SpO2 97%   Physical Exam  Constitutional: He appears well-developed and well-nourished. No distress.  HENT:  Head: Normocephalic and atraumatic.  Mouth/Throat: Oropharynx is clear and moist. No oropharyngeal exudate.  Eyes: Pupils  are equal, round, and reactive to light. Conjunctivae are normal. Right eye exhibits no discharge. Left eye exhibits no discharge. No scleral icterus.  Neck: Normal range of motion. Neck supple. No thyromegaly present.  Cardiovascular: Normal rate, regular rhythm, normal heart sounds and intact distal pulses. Exam reveals no gallop and no friction rub.  No murmur heard. Pulmonary/Chest: Effort normal and breath sounds normal. No stridor. No respiratory distress. He has no wheezes. He has no rales. He exhibits no tenderness.  No rashes or skin changes  Abdominal:  Soft. Bowel sounds are normal. He exhibits no distension. There is no tenderness. There is no rebound and no guarding.  Musculoskeletal: He exhibits no edema.       Right lower leg: He exhibits no tenderness and no edema.       Left lower leg: He exhibits no tenderness and no edema.  Lymphadenopathy:    He has no cervical adenopathy.  Neurological: He is alert. Coordination normal.  Skin: Skin is warm and dry. No rash noted. He is not diaphoretic. No pallor.  Psychiatric: He has a normal mood and affect.  Nursing note and vitals reviewed.    ED Treatments / Results  Labs (all labs ordered are listed, but only abnormal results are displayed) Labs Reviewed  BASIC METABOLIC PANEL - Abnormal; Notable for the following components:      Result Value   Sodium 134 (*)    Glucose, Bld 303 (*)    Creatinine, Ser 0.57 (*)    All other components within normal limits  CBC  D-DIMER, QUANTITATIVE (NOT AT [facility])  I-STAT TROPONIN, ED  I-STAT TROPONIN, ED    EKG EKG Interpretation  Date/Time:  Thursday May 29 2018 19:21:25 EST Ventricular Rate:  59 PR Interval:    QRS Duration: 98 QT Interval:  423 QTC Calculation: 419 R Axis:   77 Text Interpretation:  Sinus rhythm ST elev, probable normal early repol pattern Since last tracing of earlier today No significant change was found Also similar to 09/19/2011 Confirmed by Mancel Bale [phone] on 05/29/2018 7:30:34 PM   Radiology Dg Chest 2 View  Result Date: 05/29/2018 CLINICAL DATA:  Left-sided chest pain EXAM: CHEST - 2 VIEW COMPARISON:  None. FINDINGS: The heart size and mediastinal contours are within normal limits. Both lungs are clear. The visualized skeletal structures are unremarkable. IMPRESSION: No active cardiopulmonary disease. Electronically Signed   By: Tollie Eth M.D.   On: 05/29/2018 18:36    Procedures Procedures (including critical care time)  Medications Ordered in ED Medications  ibuprofen (ADVIL,MOTRIN) tablet  600 mg (600 mg Oral Given 05/29/18 2108)     Initial Impression / Assessment and Plan / ED Course  I have reviewed the triage vital signs and the nursing notes.  Pertinent labs & imaging results that were available during my care of the patient were reviewed by me and considered in my medical decision making (see chart for details).     7:27 PM Chest x-ray is clear.  EKG shows NSR. Troponin negative.  D-dimer added on due to concern for pleuritic quality of pain.  Patient's pain is mostly only present with a deep breath and I cannot reproduce the pain on palpation which makes musculoskeletal chest wall pain less likely.  Labs are unremarkable including delta troponin and d-dimer.  Suspect chest wall pain versus pericarditis, although EKG shows NSR.  He is feeling much better after ibuprofen in the ED.  Will refer to cardiology for further work-up for  possible pericarditis and treat with NSAIDs.  Return precautions discussed.  Patient understands and agrees with plan.  Patient vitals stable throughout ED course and discharged in satisfactory condition.  Final Clinical Impressions(s) / ED Diagnoses   Final diagnoses:  Nonspecific chest pain    ED Discharge Orders         Ordered    ibuprofen (ADVIL,MOTRIN) 600 MG tablet  Every 6 hours PRN     05/29/18 2122           Emi Holes, PA-C 05/29/18 2149    Mancel Bale, MD 05/30/18 [phone]

## 2018-05-29 NOTE — ED Triage Notes (Signed)
Pt endorses left sided chest pain x 2 hours, denies shob, dizziness, radiation, n/v.

## 2018-05-29 NOTE — Discharge Instructions (Signed)
Take ibuprofen every 6 hours for your pain.  Please follow-up with the cardiologist for further evaluation and treatment of your chest pain.  Avoid heavy lifting right now.  You can use ice to your chest as well, alternating 20 minutes on, 20 minutes off.

## 2019-03-23 ENCOUNTER — Encounter (HOSPITAL_COMMUNITY): Payer: Self-pay | Admitting: *Deleted

## 2019-03-23 ENCOUNTER — Other Ambulatory Visit: Payer: Self-pay

## 2019-03-23 ENCOUNTER — Emergency Department (HOSPITAL_COMMUNITY): Payer: Self-pay

## 2019-03-23 ENCOUNTER — Emergency Department (HOSPITAL_COMMUNITY)
Admission: EM | Admit: 2019-03-23 | Discharge: 2019-03-24 | Disposition: A | Discharge status: Home / Self Care | Payer: Self-pay | Attending: Emergency Medicine | Admitting: Emergency Medicine

## 2019-03-23 DIAGNOSIS — Z794 Long term (current) use of insulin: Secondary | ICD-10-CM | POA: Insufficient documentation

## 2019-03-23 DIAGNOSIS — E119 Type 2 diabetes mellitus without complications: Secondary | ICD-10-CM | POA: Insufficient documentation

## 2019-03-23 DIAGNOSIS — Y999 Unspecified external cause status: Secondary | ICD-10-CM | POA: Insufficient documentation

## 2019-03-23 DIAGNOSIS — Y929 Unspecified place or not applicable: Secondary | ICD-10-CM | POA: Insufficient documentation

## 2019-03-23 DIAGNOSIS — X58XXXA Exposure to other specified factors, initial encounter: Secondary | ICD-10-CM | POA: Insufficient documentation

## 2019-03-23 DIAGNOSIS — S93602A Unspecified sprain of left foot, initial encounter: Secondary | ICD-10-CM | POA: Insufficient documentation

## 2019-03-23 DIAGNOSIS — Y9389 Activity, other specified: Secondary | ICD-10-CM | POA: Insufficient documentation

## 2019-03-23 DIAGNOSIS — Z87891 Personal history of nicotine dependence: Secondary | ICD-10-CM | POA: Insufficient documentation

## 2019-03-23 NOTE — ED Triage Notes (Signed)
Per interpreter: Pt was walking normally, reports hopping and injuring his ankle. Reports increased pain on ambulation.

## 2019-03-24 ENCOUNTER — Other Ambulatory Visit: Payer: Self-pay

## 2019-03-24 MED ORDER — IBUPROFEN 600 MG PO TABS: 600.0000 mg | ORAL_TABLET | Freq: Four times a day (QID) | ORAL | 0 refills | Status: AC | PRN

## 2019-03-24 MED ORDER — IBUPROFEN 800 MG PO TABS
800.0000 mg | ORAL_TABLET | Freq: Once | ORAL | Status: AC
Start: 2019-03-24 — End: 2019-03-24
  Administered 2019-03-24: 01:00:00 800 mg via ORAL
  Filled 2019-03-24: qty 1

## 2019-03-24 NOTE — ED Provider Notes (Signed)
[facility] EMERGENCY DEPARTMENT Provider Note   CSN: [id number] Arrival date & time: 03/23/19  2251     History   Chief Complaint Chief Complaint  Patient presents with  . Ankle Pain    HPI Kyle Rubio is a 33 y.o. male.     The history is provided by the patient. No language interpreter was used.  Ankle Pain Location:  Foot Time since incident: this afternoon. Lower extremity injury: unclear.   Foot location:  L foot Pain details:    Radiates to:  Does not radiate   Severity:  Mild   Duration:  1 day   Timing:  Constant   Progression:  Unchanged Chronicity:  New Dislocation: no   Prior injury to area:  No Relieved by:  Nothing Worsened by:  Bearing weight Ineffective treatments:  None tried Associated symptoms: no decreased ROM, no fever, no numbness and no tingling   Risk factors: no obesity     Past Medical History:  Diagnosis Date  . Dehydration   . Diabetes mellitus    new onset    Patient Active Problem List   Diagnosis Date Noted  . Newly diagnosed diabetes (HCC) 09/20/2011  . Dehydration 09/20/2011  . Metabolic acidosis 09/20/2011  . DKA (diabetic ketoacidoses) (HCC) 09/20/2011  . Hypokalemia 09/20/2011    Past Surgical History:  Procedure Laterality Date  . NO PAST SURGERIES          Home Medications    Prior to Admission medications   Medication Sig Start Date End Date Taking? Authorizing Provider  ibuprofen (ADVIL) 600 MG tablet Take 1 tablet (600 mg total) by mouth every 6 (six) hours as needed for mild pain or moderate pain. 03/24/19   Antony Madura, PA-C  insulin aspart protamine-insulin aspart (NOVOLOG 70/30) (70-30) 100 UNIT/ML injection Inject 18 Units into the skin 2 (two) times daily with a meal. 09/21/11 09/20/12  Ghimire, Werner Lean, MD  simvastatin (ZOCOR) 5 MG tablet Take 1 tablet (5 mg total) by mouth at bedtime. 09/21/11 09/20/12  Maretta Bees, MD    Family History No family history  on file.  Social History Social History   Tobacco Use  . Smoking status: Former Smoker    Quit date: 08/15/2011    Years since quitting: 7.6  . Smokeless tobacco: Never Used  Substance Use Topics  . Alcohol use: Yes    Comment: 3 drinks week  . Drug use: No     Allergies   Patient has no known allergies.   Review of Systems Review of Systems  Constitutional: Negative for fever.  Ten systems reviewed and are negative for acute change, except as noted in the HPI.    Physical Exam Updated Vital Signs BP (!) 149/87 (BP Location: Left Arm)   Pulse 96   Temp 99.8 F (37.7 C) (Oral)   Resp 16   SpO2 97%   Physical Exam Vitals signs and nursing note reviewed.  Constitutional:      General: He is not in acute distress.    Appearance: He is well-developed. He is not diaphoretic.     Comments: Nontoxic appearing and in NAD  HENT:     Head: Normocephalic and atraumatic.  Eyes:     General: No scleral icterus.    Conjunctiva/sclera: Conjunctivae normal.  Neck:     Musculoskeletal: Normal range of motion.  Cardiovascular:     Rate and Rhythm: Normal rate and regular rhythm.     Pulses: Normal  pulses.     Comments: DP pulse 2+ in the LLE Pulmonary:     Effort: Pulmonary effort is normal. No respiratory distress.  Musculoskeletal: Normal range of motion.     Left ankle: Achilles tendon normal.     Left foot: Normal range of motion. Tenderness present. No swelling, crepitus or deformity.       Feet:     Comments: No tenderness to palpation of the lateral or medial malleolus.  Mild tenderness to the left midfoot without crepitus or deformity. No significant soft tissue edema.  Skin:    General: Skin is warm and dry.     Coloration: Skin is not pale.     Findings: No erythema or rash.  Neurological:     General: No focal deficit present.     Mental Status: He is alert and oriented to person, place, and time.     Coordination: Coordination normal.     Comments:  Sensation to light touch intact.  Patient able to wiggle all toes.  Psychiatric:        Behavior: Behavior normal.      ED Treatments / Results  Labs (all labs ordered are listed, but only abnormal results are displayed) Labs Reviewed - No data to display  EKG None  Radiology Dg Ankle Complete Left  Result Date: 03/23/2019 CLINICAL DATA:  Pain with ambulation EXAM: LEFT ANKLE COMPLETE - 3+ VIEW COMPARISON:  None. FINDINGS: Mild lateral ankle swelling. No effusion. No acute bony abnormality. Specifically, no fracture, subluxation, or dislocation. There is no evidence of arthropathy or other focal bone abnormality. Tiny soft tissue mineralization adjacent the distal fibula, may reflect sequela of prior injury. IMPRESSION: Mild lateral ankle swelling. No acute osseous abnormality. Electronically Signed   By: Lovena Le M.D.   On: 03/23/2019 23:23    Procedures Procedures (including critical care time)  Medications Ordered in ED Medications  ibuprofen (ADVIL) tablet 800 mg (has no administration in time range)     Initial Impression / Assessment and Plan / ED Course  I have reviewed the triage vital signs and the nursing notes.  Pertinent labs & imaging results that were available during my care of the patient were reviewed by me and considered in my medical decision making (see chart for details).        Patient presents to the emergency department for evaluation of L foot pain. Patient neurovascularly intact on exam. Imaging of ankle to midfoot negative for fracture, dislocation, bony deformity. No swelling, erythema, heat to touch to the affected area; no concern for septic joint. Compartments in the affected extremity are soft. Plan for supportive management including RICE and NSAIDs; primary care follow up as needed. Return precautions discussed and provided. Patient discharged in stable condition with no unaddressed concerns.   Final Clinical Impressions(s) / ED  Diagnoses   Final diagnoses:  Sprain of left foot, initial encounter    ED Discharge Orders         Ordered    ibuprofen (ADVIL) 600 MG tablet  Every 6 hours PRN     03/24/19 0104           Antonietta Breach, PA-C 03/24/19 0125    Fatima Blank, MD 03/24/19 [phone]

## 2019-03-24 NOTE — Discharge Instructions (Signed)
Your xrays were normal and did not show a fracture or broken bone. Ice your foot 3-4 times per day to limit swelling. Take ibuprofen as prescribed for pain. Follow up with a primary care doctor for any ongoing symptoms.  Sus radiografas fueron normales y no mostraron fracturas ni huesos rotos. Aplique hielo en su pie 3-4 veces al da para limitar la hinchazn. Tome ibuprofeno segn lo prescrito para Conservation officer, historic buildings. Haga un seguimiento con un mdico de atencin primaria para Actuary sntoma continuo.

## 2019-03-24 NOTE — Progress Notes (Signed)
Orthopedic Tech Progress Note Patient Details:  Kyle Rubio 07/03/1985 [id number]  Ortho Devices Type of Ortho Device: Ace wrap Ortho Device/Splint Location: lle ankle ace wrap Ortho Device/Splint Interventions: Ordered, Application, Adjustment   Post Interventions Patient Tolerated: Well Instructions Provided: Care of device, Adjustment of device   Karolee Stamps 03/24/2019, 1:34 AM

## 2019-09-10 ENCOUNTER — Ambulatory Visit: Payer: Self-pay | Attending: Internal Medicine

## 2019-09-10 DIAGNOSIS — Z23 Encounter for immunization: Secondary | ICD-10-CM

## 2019-09-10 NOTE — Progress Notes (Signed)
   [id number]  Name:  Haik Mahoney Sr.    MRN: [id number] DOB: 01-10-1986  09/10/2019  [name] was observed post Covid-19 immunization for 15 minutes without incident. He was provided with Vaccine Information Sheet and instruction to access the V-Safe system.   [name] was instructed to call 911 with any severe reactions post vaccine: Marland Kitchen Difficulty breathing  . Swelling of face and throat  . A fast heartbeat  . A bad rash all over body  . Dizziness and weakness   Immunizations Administered    Name Date Dose VIS Date Route   Pfizer COVID-19 Vaccine 09/10/2019  8:23 AM 0.3 mL 06/05/2019 Intramuscular   Manufacturer: [employer], [employer]   Lot: [id number]   NDC: [address]-7

## 2019-10-06 ENCOUNTER — Ambulatory Visit: Payer: Self-pay | Attending: Internal Medicine

## 2019-10-06 DIAGNOSIS — Z23 Encounter for immunization: Secondary | ICD-10-CM

## 2019-10-06 NOTE — Progress Notes (Signed)
   [id number]  Name:  [name]    MRN: [id number] DOB: Aug 12, 1985  10/06/2019  [name] was observed post Covid-19 immunization for 15 minutes without incident. He was provided with Vaccine Information Sheet and instruction to access the V-Safe system.   [name] was instructed to call 911 with any severe reactions post vaccine: Marland Kitchen Difficulty breathing  . Swelling of face and throat  . A fast heartbeat  . A bad rash all over body  . Dizziness and weakness   Immunizations Administered    Name Date Dose VIS Date Route   Pfizer COVID-19 Vaccine 10/06/2019  8:21 AM 0.3 mL 06/05/2019 Intramuscular   Manufacturer: [employer], [employer]   Lot: [id number]   NDC: [address]-0

## 2021-04-28 IMAGING — DX DG ANKLE COMPLETE 3+V*L*
3 series · 3 of 3 positions shown · non-contrast
Comparison: None.

CLINICAL DATA: Pain with ambulation

EXAM:
LEFT ANKLE COMPLETE - 3+ VIEW

[ankle ap]
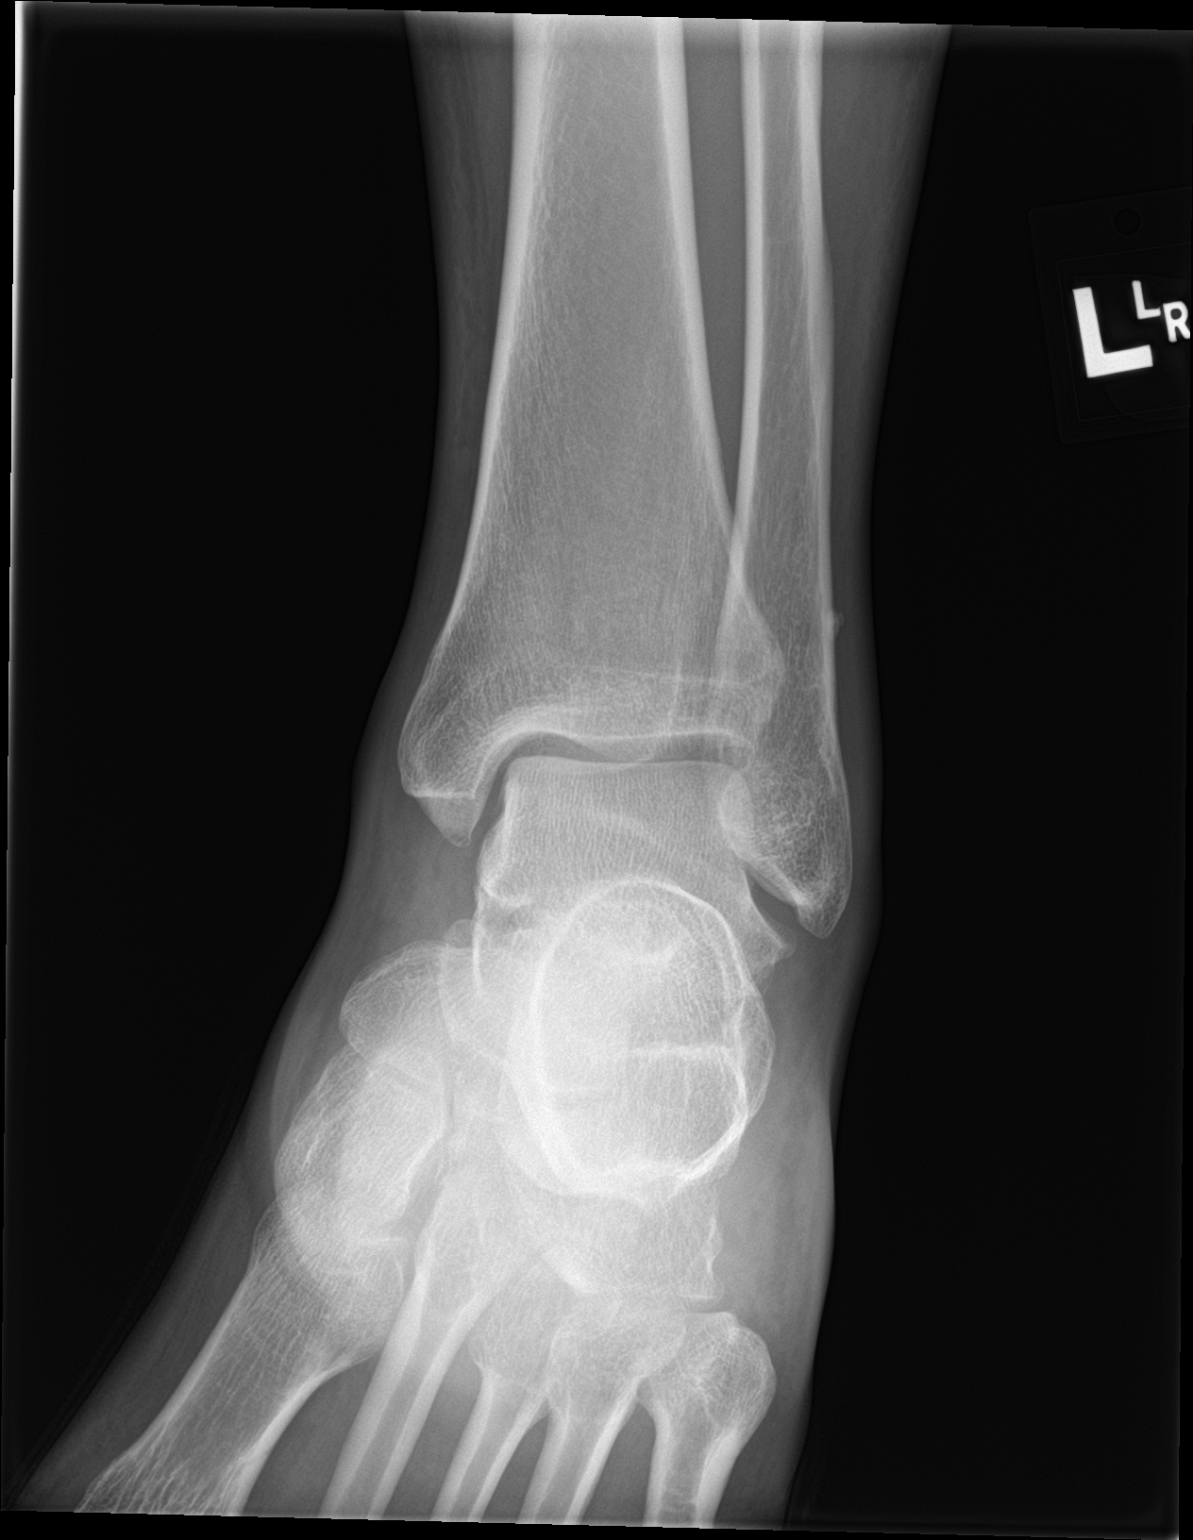

[ankle obl]
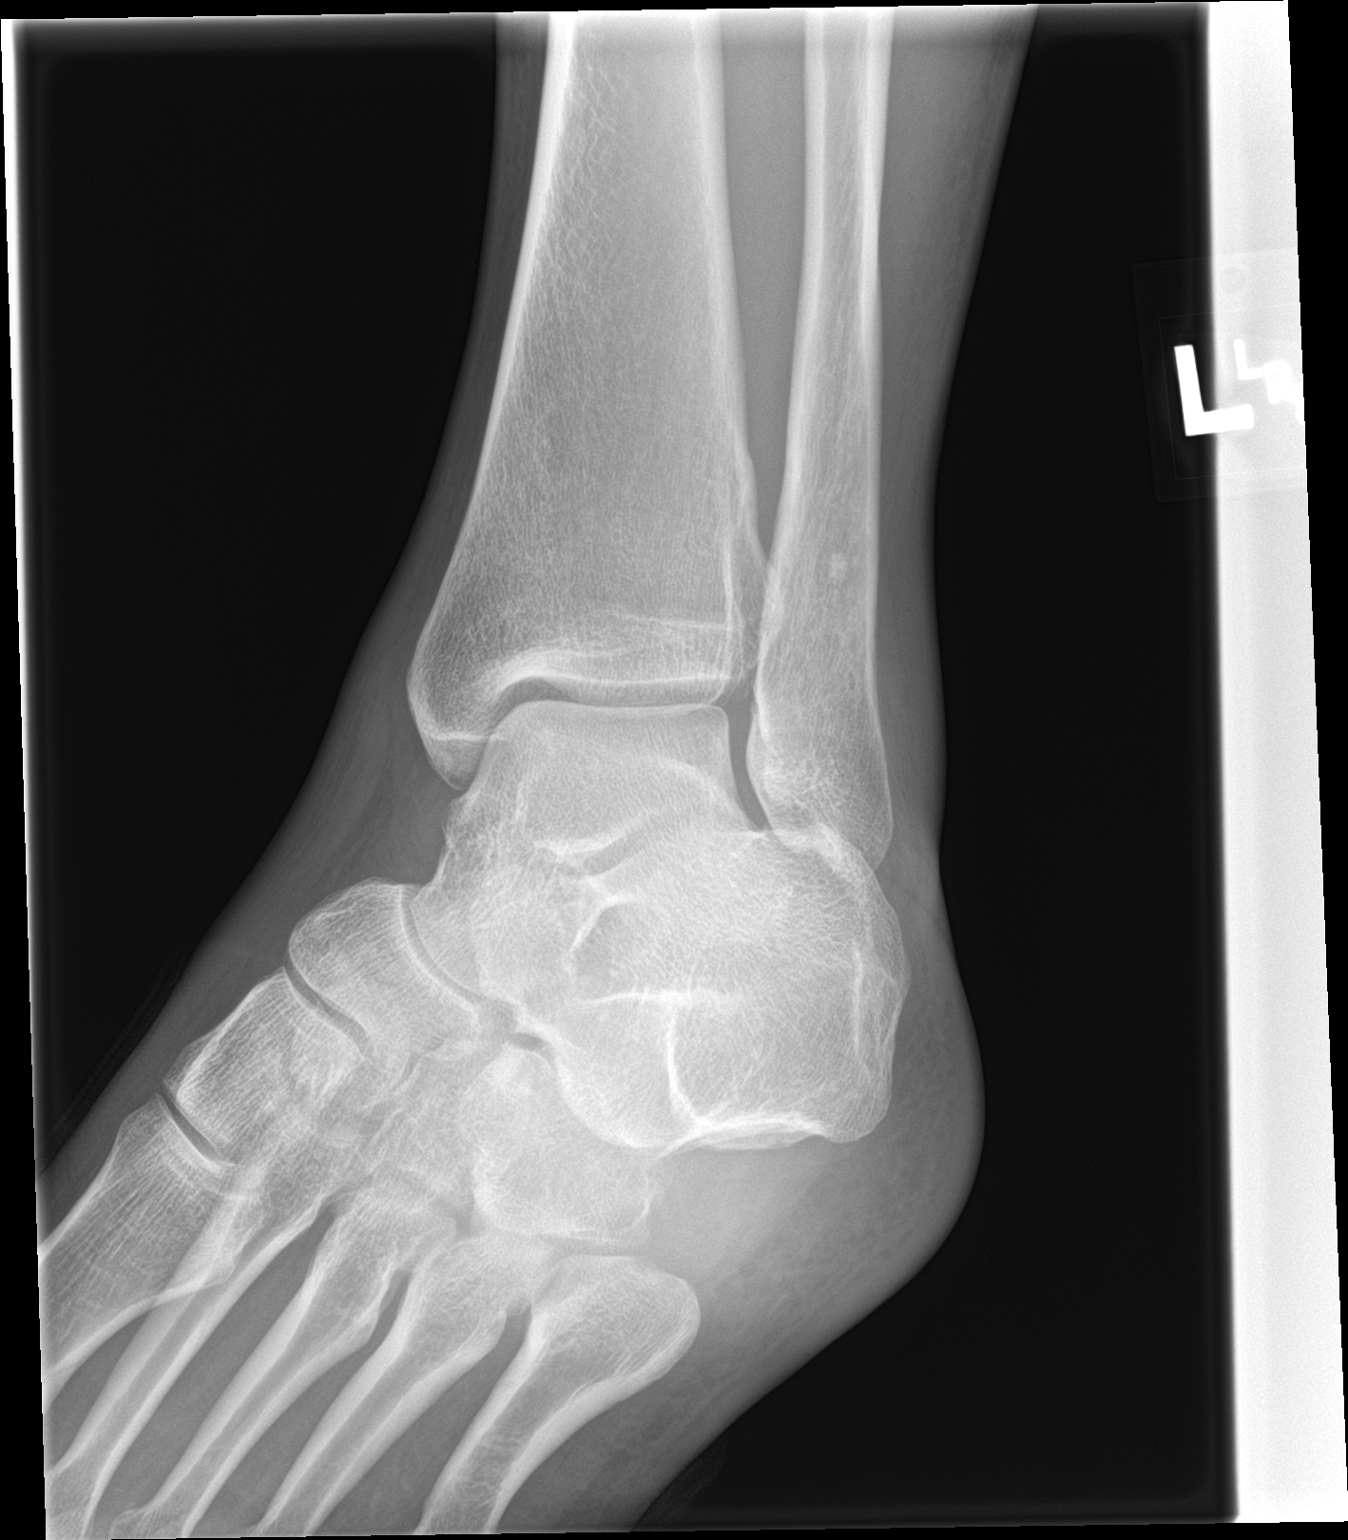

[ankle lat]
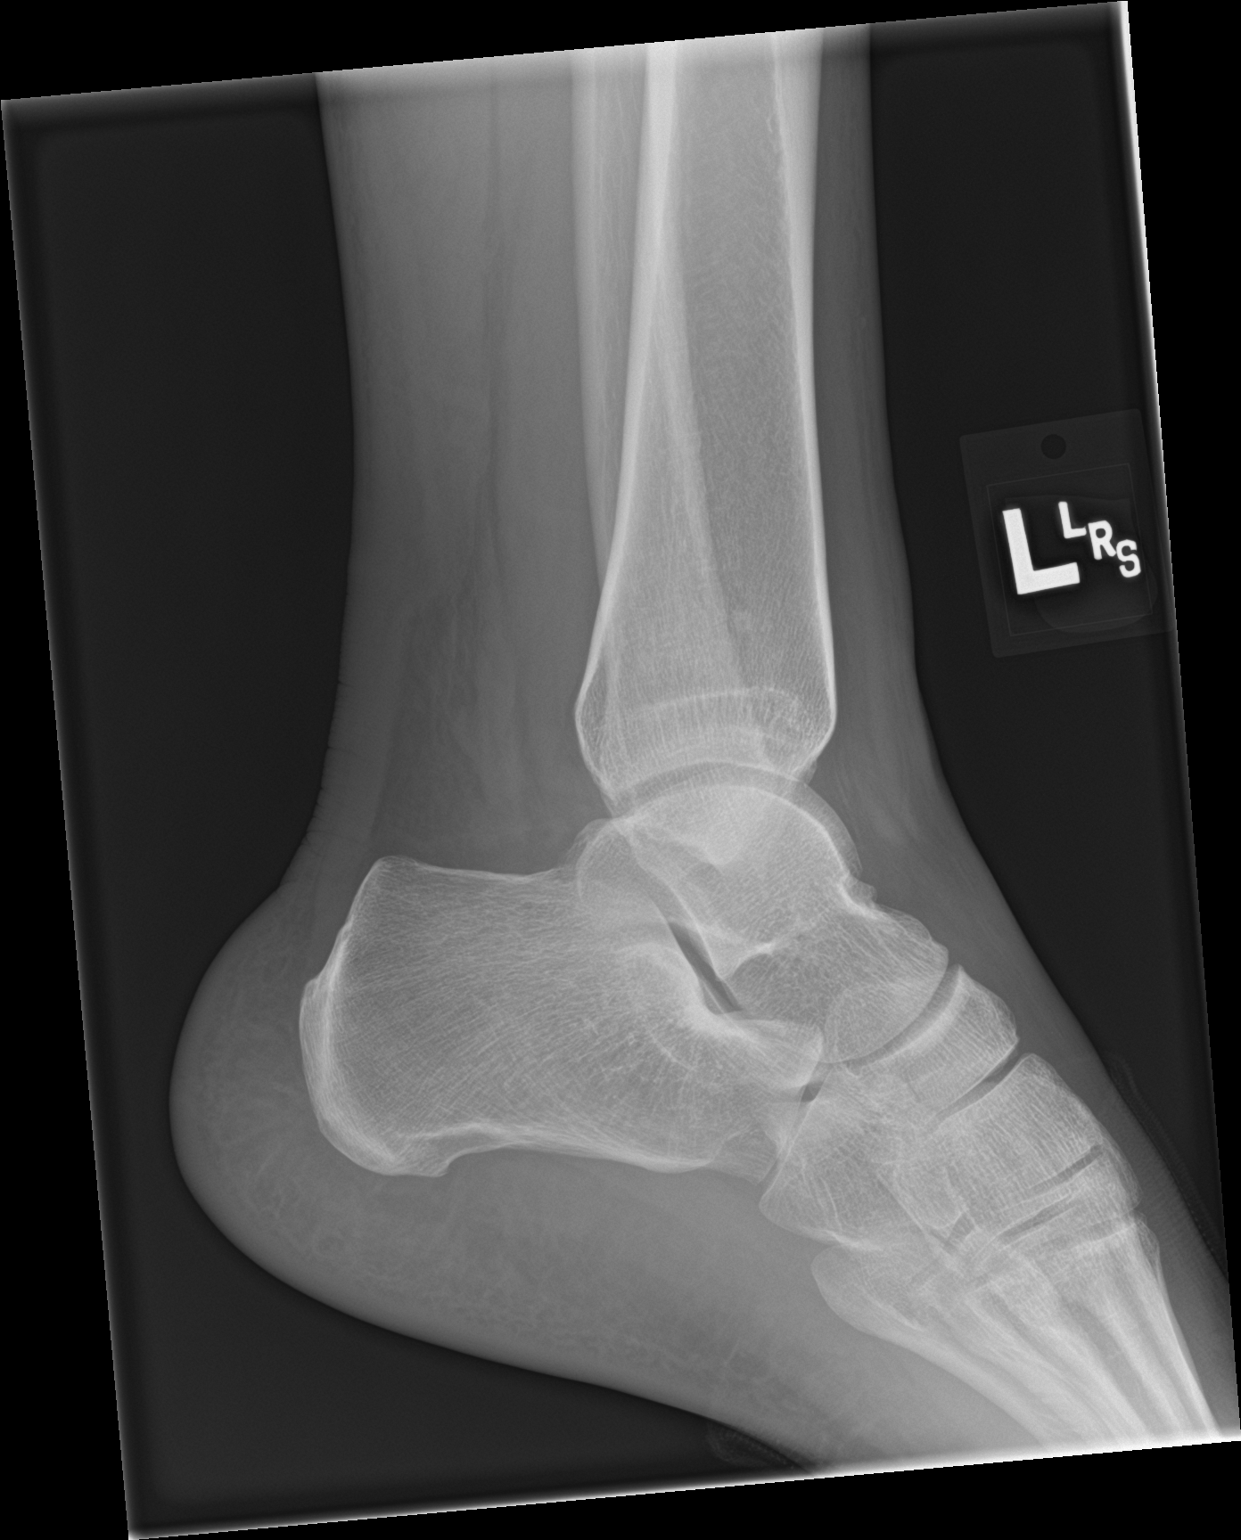

[3 of 3 positions shown; findings below may reference images not displayed]

FINDINGS: Mild lateral ankle swelling. No effusion. No acute bony abnormality.
Specifically, no fracture, subluxation, or dislocation. There is no
evidence of arthropathy or other focal bone abnormality. Tiny soft
tissue mineralization adjacent the distal fibula, may reflect
sequela of prior injury.
IMPRESSION: Mild lateral ankle swelling. No acute osseous abnormality.

## 2021-12-24 ENCOUNTER — Emergency Department (HOSPITAL_COMMUNITY)
Admission: EM | Admit: 2021-12-24 | Discharge: 2021-12-24 | Disposition: A | Discharge status: Home / Self Care | Payer: Self-pay | Attending: Emergency Medicine | Admitting: Emergency Medicine

## 2021-12-24 ENCOUNTER — Encounter (HOSPITAL_COMMUNITY): Payer: Self-pay

## 2021-12-24 ENCOUNTER — Emergency Department (HOSPITAL_COMMUNITY): Payer: Self-pay

## 2021-12-24 ENCOUNTER — Other Ambulatory Visit: Payer: Self-pay

## 2021-12-24 DIAGNOSIS — E1165 Type 2 diabetes mellitus with hyperglycemia: Secondary | ICD-10-CM | POA: Insufficient documentation

## 2021-12-24 DIAGNOSIS — Z794 Long term (current) use of insulin: Secondary | ICD-10-CM | POA: Insufficient documentation

## 2021-12-24 DIAGNOSIS — M7989 Other specified soft tissue disorders: Secondary | ICD-10-CM | POA: Insufficient documentation

## 2021-12-24 LAB — COMPREHENSIVE METABOLIC PANEL
ALT: 19 U/L (ref 0–44)
AST: 11 U/L — ABNORMAL LOW (ref 15–41)
Albumin: 3.8 g/dL (ref 3.5–5.0)
Alkaline Phosphatase: 142 U/L — ABNORMAL HIGH (ref 38–126)
Anion gap: 13 (ref 5–15)
BUN: 12 mg/dL (ref 6–20)
CO2: 24 mmol/L (ref 22–32)
Calcium: 9.2 mg/dL (ref 8.9–10.3)
Chloride: 96 mmol/L — ABNORMAL LOW (ref 98–111)
Creatinine, Ser: 0.8 mg/dL (ref 0.61–1.24)
GFR, Estimated: >60 mL/min (ref >60)
Glucose, Bld: 571 mg/dL (ref 70–99)
Potassium: 4.9 mmol/L (ref 3.5–5.1)
Sodium: 133 mmol/L — ABNORMAL LOW (ref 135–145)
Total Bilirubin: 1.5 mg/dL — ABNORMAL HIGH (ref 0.3–1.2)
Total Protein: 6.6 g/dL (ref 6.5–8.1)

## 2021-12-24 LAB — CBC WITH DIFFERENTIAL/PLATELET
Abs Immature Granulocytes: 0.03 10*3/uL (ref 0.00–0.07)
Basophils Absolute: 0 10*3/uL (ref 0.0–0.1)
Basophils Relative: 1 %
Eosinophils Absolute: 0.1 10*3/uL (ref 0.0–0.5)
Eosinophils Relative: 1 %
HCT: 41.3 % (ref 39.0–52.0)
Hemoglobin: 14 g/dL (ref 13.0–17.0)
Immature Granulocytes: 0 %
Lymphocytes Relative: 12 %
Lymphs Abs: 1 10*3/uL (ref 0.7–4.0)
MCH: 29.5 pg (ref 26.0–34.0)
MCHC: 33.9 g/dL (ref 30.0–36.0)
MCV: 86.9 fL (ref 80.0–100.0)
Monocytes Absolute: 0.5 10*3/uL (ref 0.1–1.0)
Monocytes Relative: 6 %
Neutro Abs: 6.6 10*3/uL (ref 1.7–7.7)
Neutrophils Relative %: 80 %
Platelets: 261 10*3/uL (ref 150–400)
RBC: 4.75 MIL/uL (ref 4.22–5.81)
RDW: 11.9 % (ref 11.5–15.5)
WBC: 8.3 10*3/uL (ref 4.0–10.5)
nRBC: 0 % (ref 0.0–0.2)

## 2021-12-24 LAB — CBG MONITORING, ED
Glucose-Capillary: 510 mg/dL (ref 70–99)
Glucose-Capillary: 274 mg/dL — ABNORMAL HIGH (ref 70–99)

## 2021-12-24 LAB — LACTIC ACID, PLASMA: Lactic Acid, Venous: 0.9 mmol/L (ref 0.5–1.9)

## 2021-12-24 MED ORDER — CEPHALEXIN 250 MG PO CAPS: 500.0000 mg | ORAL_CAPSULE | Freq: Once | ORAL | Status: DC

## 2021-12-24 MED ORDER — CEPHALEXIN 500 MG PO CAPS: 500.0000 mg | ORAL_CAPSULE | Freq: Four times a day (QID) | ORAL | 0 refills | Status: DC

## 2021-12-24 MED ORDER — SODIUM CHLORIDE 0.9 % IV BOLUS
1000.0000 mL | Freq: Once | INTRAVENOUS | Status: AC
  Administered 2021-12-24: 1000 mL via INTRAVENOUS

## 2021-12-24 MED ORDER — METFORMIN HCL 500 MG PO TABS: 500.0000 mg | ORAL_TABLET | Freq: Two times a day (BID) | ORAL | 0 refills | Status: DC

## 2021-12-24 MED ORDER — INSULIN ASPART 100 UNIT/ML IJ SOLN
8.0000 [IU] | Freq: Once | INTRAMUSCULAR | Status: AC
  Administered 2021-12-24: 8 [IU] via SUBCUTANEOUS

## 2021-12-24 NOTE — ED Provider Notes (Signed)
[name] EMERGENCY DEPARTMENT Provider Note   CSN: [id number] Arrival date & time: 12/24/21  1411     History  No chief complaint on file.   Kyle Rubio is a 36 y.o. male.  Patient presents to the emergency department for evaluation of left foot swelling.  He has a history of diabetes and takes metformin and insulin for this.  He has noted some minor swelling and tenderness over the past 3 weeks, however there has been increasing pain and swelling over the past 3 days.  He denies injuries, falls or traumas.  Denies fever, vomiting.  Pain is worse with movement.  The onset of this condition was acute. The course is constant. Aggravating factors: none. Alleviating factors: none.         Home Medications Prior to Admission medications   Medication Sig Start Date End Date Taking? Authorizing Provider  ibuprofen (ADVIL) 600 MG tablet Take 1 tablet (600 mg total) by mouth every 6 (six) hours as needed for mild pain or moderate pain. 03/24/19   Antony Madura, PA-C  insulin aspart protamine-insulin aspart (NOVOLOG 70/30) (70-30) 100 UNIT/ML injection Inject 18 Units into the skin 2 (two) times daily with a meal. 09/21/11 09/20/12  Ghimire, Werner Lean, MD  simvastatin (ZOCOR) 5 MG tablet Take 1 tablet (5 mg total) by mouth at bedtime. 09/21/11 09/20/12  GhimireWerner Lean, MD      Allergies    Patient has no known allergies.    Review of Systems   Review of Systems  Physical Exam Updated Vital Signs BP 119/78   Pulse 80   Temp 99 F (37.2 C) (Oral)   Resp 12   SpO2 98%   Physical Exam Vitals and nursing note reviewed.  Constitutional:      Appearance: He is well-developed.  HENT:     Head: Normocephalic and atraumatic.     Right Ear: External ear normal.     Left Ear: External ear normal.  Eyes:     Conjunctiva/sclera: Conjunctivae normal.  Cardiovascular:     Pulses: Normal pulses. No decreased pulses.  Musculoskeletal:        General:  Tenderness present.     Cervical back: Normal range of motion and neck supple.     Right lower leg: No edema.     Left lower leg: No edema.     Comments: Patient has a hyperpigmented area over the superior lateral aspect of the left foot with surrounding erythema.  Patient states that the dark area is from a remote injury.  States that the erythematous area is from where he was putting warm water on the foot yesterday.  Foot is generally edematous, 1+ pitting edema.  Patient has mild pain when I passively flex and extend at the ankle and varus and valgus stresses.  Skin:    General: Skin is warm and dry.  Neurological:     Mental Status: He is alert.     Sensory: No sensory deficit.     Comments: Motor, sensation, and vascular distal to the injury is fully intact.   Psychiatric:        Mood and Affect: Mood normal.         ED Results / Procedures / Treatments   Labs (all labs ordered are listed, but only abnormal results are displayed) Labs Reviewed  COMPREHENSIVE METABOLIC PANEL - Abnormal; Notable for the following components:      Result Value   Sodium 133 (*)  Chloride 96 (*)    Glucose, Bld 571 (*)    AST 11 (*)    Alkaline Phosphatase 142 (*)    Total Bilirubin 1.5 (*)    All other components within normal limits  CBG MONITORING, ED - Abnormal; Notable for the following components:   Glucose-Capillary 510 (*)    All other components within normal limits  CBG MONITORING, ED - Abnormal; Notable for the following components:   Glucose-Capillary 274 (*)    All other components within normal limits  LACTIC ACID, PLASMA  CBC WITH DIFFERENTIAL/PLATELET  URINALYSIS, ROUTINE W REFLEX MICROSCOPIC    EKG None  Radiology DG Foot Complete Left  Result Date: 12/24/2021 CLINICAL DATA:  Left foot pain and swelling for 3 weeks. No known injury. EXAM: LEFT FOOT - COMPLETE 3+ VIEW COMPARISON:  None Available. FINDINGS: There is no evidence of fracture or dislocation. There is no  evidence of arthropathy or other focal bone abnormality. Soft tissues are unremarkable. IMPRESSION: Negative. Electronically Signed   By: Danae Orleans M.D.   On: 12/24/2021 15:52    Procedures Procedures    Medications Ordered in ED Medications  cephALEXin (KEFLEX) capsule 500 mg (has no administration in time range)  sodium chloride 0.9 % bolus 1,000 mL (1,000 mLs Intravenous New Bag/Given 12/24/21 1648)  insulin aspart (novoLOG) injection 8 Units (8 Units Subcutaneous Given 12/24/21 1649)    ED Course/ Medical Decision Making/ A&P    Patient seen and examined. History obtained directly from patient. Work-up including labs, imaging, EKG ordered in triage, if performed, were reviewed.    Labs/EKG: Independently reviewed and interpreted.  This included: CBC with normal white blood cell count, normal hemoglobin; CBG 510.  Awaiting CMP and lipase.  Imaging: Independently visualized and interpreted.  This included: X-ray of the left foot.  Agree no acute bony changes.  Medications/Fluids: Ordered: IV fluid bolus, subcutaneous insulin.   Most recent vital signs reviewed and are as follows: BP 119/78   Pulse 80   Temp 99 F (37.2 C) (Oral)   Resp 12   SpO2 98%   Initial impression: foot swelling, possible cellulitis. No abscess. Doubt osteomyelitis, necrotizing fasciitis. Vitals reassuring.   7:12 PM Reassessment performed. Patient appears stable.  Labs personally reviewed and interpreted including: CMP with glucose 571 with normal anion gap, sodium 133, corrects to normal, potassium 4.9; lactate normal.  Reviewed pertinent lab work and imaging with patient at bedside. Questions answered.   Patient was treated with subcutaneous insulin and IV fluid bolus with improvement of glucose into the 200s.  Most current vital signs reviewed and are as follows: BP 130/82 (BP Location: Right Arm)   Pulse 62   Temp 98.1 F (36.7 C) (Oral)   Resp 17   SpO2 99%   Plan: Discharge to home.    Prescriptions written for: Keflex  Other home care instructions discussed: Elevation of lower extremity  ED return instructions discussed: Pt urged to return with worsening pain, worsening swelling, expanding area of redness or streaking up extremity, fever, or any other concerns.    Follow-up instructions discussed: Patient encouraged to follow-up with their PCP in 3 days.                             Medical Decision Making Amount and/or Complexity of Data Reviewed Labs: ordered. Radiology: ordered.   Patient presents with hyperglycemia without DKA.  Also foot swelling.  Patient has been off of  at least one of his antihyperglycemic's and has been given a prescription for metformin.  No injury to the foot.  X-rays negative.  Low concern for osteomyelitis, necrotizing fasciitis.  This could be mild cellulitis.  No concern for sepsis.  Also considered inflammatory arthritis.  Patient does not have a history of gout.  We will treat conservatively and cover with Keflex at this point.  Encouraged outpatient follow-up.  Return instructions as above.  The patient's vital signs, pertinent lab work and imaging were reviewed and interpreted as discussed in the ED course. Hospitalization was considered for further testing, treatments, or serial exams/observation. However as patient is well-appearing, has a stable exam, and reassuring studies today, I do not feel that they warrant admission at this time. This plan was discussed with the patient who verbalizes agreement and comfort with this plan and seems reliable and able to return to the Emergency Department with worsening or changing symptoms.          Final Clinical Impression(s) / ED Diagnoses Final diagnoses:  Foot swelling    Rx / DC Orders ED Discharge Orders     None         Renne Crigler, Cordelia Poche 12/24/21 1923    Lorre Nick, MD 12/28/21 [phone]

## 2021-12-24 NOTE — ED Notes (Signed)
Discharge instructions reviewed with patient. Patient verbalized understanding of instructions. Follow-up care and medications were reviewed. Patient ambulatory with steady gait. VSS upon discharge.  ?

## 2021-12-24 NOTE — ED Notes (Signed)
This RN did not give pt his discharge meds of Keflex upon pt's discharge. PA notified.

## 2021-12-24 NOTE — Discharge Instructions (Signed)
Please read and follow all provided instructions.  Your diagnoses today include:  1. Foot swelling     Tests performed today include: Vital signs. See below for your results today.   Medications prescribed:  Keflex (cephalexin) - antibiotic  You have been prescribed an antibiotic medicine: take the entire course of medicine even if you are feeling better. Stopping early can cause the antibiotic not to work.  Take any prescribed medications only as directed.   Home care instructions:  Follow any educational materials contained in this packet. Keep affected area above the level of your heart when possible. Wash area gently twice a day with warm soapy water. Do not apply alcohol or hydrogen peroxide. Cover the area if it draining or weeping.   Follow-up instructions: Please follow-up with your primary care provider in the next 1 week for further evaluation of your symptoms.   Return instructions:  Return to the Emergency Department if you have: Fever Worsening symptoms Worsening pain Worsening swelling Redness of the skin that moves away from the affected area, especially if it streaks away from the affected area  Any other emergent concerns  Your vital signs today were: BP 130/82 (BP Location: Right Arm)   Pulse 62   Temp 98.1 F (36.7 C) (Oral)   Resp 17   SpO2 99%  If your blood pressure (BP) was elevated above 135/85 this visit, please have this repeated by your doctor within one month. --------------

## 2021-12-24 NOTE — ED Triage Notes (Signed)
Patient complains of left foot swelling x 2-3 weeks, denies trauma. Reports in the mornings the swelling is gone but increases as the day goes on. Patient alert and oriented, denies trauma. Diabetic and no wound noted

## 2021-12-29 ENCOUNTER — Emergency Department (HOSPITAL_COMMUNITY): Payer: Self-pay

## 2021-12-29 ENCOUNTER — Encounter (HOSPITAL_COMMUNITY): Payer: Self-pay | Admitting: Emergency Medicine

## 2021-12-29 ENCOUNTER — Other Ambulatory Visit: Payer: Self-pay

## 2021-12-29 ENCOUNTER — Inpatient Hospital Stay (HOSPITAL_COMMUNITY)
Admission: EM | Admit: 2021-12-29 | Discharge: 2022-01-02 | DRG: 638 | Disposition: A | Discharge status: Home / Self Care | Payer: Self-pay | Attending: Family Medicine | Admitting: Family Medicine

## 2021-12-29 DIAGNOSIS — L03116 Cellulitis of left lower limb: Secondary | ICD-10-CM

## 2021-12-29 DIAGNOSIS — R011 Cardiac murmur, unspecified: Secondary | ICD-10-CM | POA: Diagnosis present

## 2021-12-29 DIAGNOSIS — Z7984 Long term (current) use of oral hypoglycemic drugs: Secondary | ICD-10-CM

## 2021-12-29 DIAGNOSIS — E119 Type 2 diabetes mellitus without complications: Secondary | ICD-10-CM

## 2021-12-29 DIAGNOSIS — E785 Hyperlipidemia, unspecified: Secondary | ICD-10-CM | POA: Diagnosis present

## 2021-12-29 DIAGNOSIS — L0390 Cellulitis, unspecified: Secondary | ICD-10-CM | POA: Diagnosis present

## 2021-12-29 DIAGNOSIS — I10 Essential (primary) hypertension: Secondary | ICD-10-CM | POA: Diagnosis present

## 2021-12-29 DIAGNOSIS — E1165 Type 2 diabetes mellitus with hyperglycemia: Secondary | ICD-10-CM | POA: Diagnosis present

## 2021-12-29 DIAGNOSIS — E11628 Type 2 diabetes mellitus with other skin complications: Principal | ICD-10-CM | POA: Diagnosis present

## 2021-12-29 DIAGNOSIS — E1169 Type 2 diabetes mellitus with other specified complication: Secondary | ICD-10-CM

## 2021-12-29 LAB — COMPREHENSIVE METABOLIC PANEL
ALT: 11 U/L (ref 0–44)
AST: 11 U/L — ABNORMAL LOW (ref 15–41)
Albumin: 3.2 g/dL — ABNORMAL LOW (ref 3.5–5.0)
Alkaline Phosphatase: 82 U/L (ref 38–126)
Anion gap: 12 (ref 5–15)
BUN: 14 mg/dL (ref 6–20)
CO2: 25 mmol/L (ref 22–32)
Calcium: 9 mg/dL (ref 8.9–10.3)
Chloride: 102 mmol/L (ref 98–111)
Creatinine, Ser: 0.7 mg/dL (ref 0.61–1.24)
GFR, Estimated: >60 mL/min (ref >60)
Glucose, Bld: 258 mg/dL — ABNORMAL HIGH (ref 70–99)
Potassium: 4.6 mmol/L (ref 3.5–5.1)
Sodium: 139 mmol/L (ref 135–145)
Total Bilirubin: 0.5 mg/dL (ref 0.3–1.2)
Total Protein: 6.7 g/dL (ref 6.5–8.1)

## 2021-12-29 LAB — CBC WITH DIFFERENTIAL/PLATELET
Abs Immature Granulocytes: 0.02 10*3/uL (ref 0.00–0.07)
Basophils Absolute: 0 10*3/uL (ref 0.0–0.1)
Basophils Relative: 1 %
Eosinophils Absolute: 0.1 10*3/uL (ref 0.0–0.5)
Eosinophils Relative: 2 %
HCT: 41.1 % (ref 39.0–52.0)
Hemoglobin: 13.6 g/dL (ref 13.0–17.0)
Immature Granulocytes: 0 %
Lymphocytes Relative: 26 %
Lymphs Abs: 1.9 10*3/uL (ref 0.7–4.0)
MCH: 28.8 pg (ref 26.0–34.0)
MCHC: 33.1 g/dL (ref 30.0–36.0)
MCV: 86.9 fL (ref 80.0–100.0)
Monocytes Absolute: 0.7 10*3/uL (ref 0.1–1.0)
Monocytes Relative: 10 %
Neutro Abs: 4.5 10*3/uL (ref 1.7–7.7)
Neutrophils Relative %: 61 %
Platelets: 337 10*3/uL (ref 150–400)
RBC: 4.73 MIL/uL (ref 4.22–5.81)
RDW: 11.7 % (ref 11.5–15.5)
WBC: 7.3 10*3/uL (ref 4.0–10.5)
nRBC: 0 % (ref 0.0–0.2)

## 2021-12-29 LAB — GLUCOSE, CAPILLARY
Glucose-Capillary: 232 mg/dL — ABNORMAL HIGH (ref 70–99)
Glucose-Capillary: 316 mg/dL — ABNORMAL HIGH (ref 70–99)

## 2021-12-29 MED ORDER — ACETAMINOPHEN 325 MG PO TABS
650.0000 mg | ORAL_TABLET | Freq: Four times a day (QID) | ORAL | Status: DC
  Administered 2021-12-29 – 2022-01-02 (×14): 650 mg via ORAL
  Filled 2021-12-29 (×12): qty 2

## 2021-12-29 MED ORDER — SODIUM CHLORIDE 0.9 % IV SOLN
250.0000 mL | INTRAVENOUS | Status: DC | PRN
Start: 2021-12-29 — End: 2022-01-02

## 2021-12-29 MED ORDER — ORAL CARE MOUTH RINSE: 15.0000 mL | OROMUCOSAL | Status: DC | PRN

## 2021-12-29 MED ORDER — ACETAMINOPHEN 325 MG PO TABS: 650.0000 mg | ORAL_TABLET | Freq: Four times a day (QID) | ORAL | Status: DC | PRN

## 2021-12-29 MED ORDER — IBUPROFEN 200 MG PO TABS
600.0000 mg | ORAL_TABLET | Freq: Four times a day (QID) | ORAL | Status: DC | PRN
  Administered 2021-12-29 – 2022-01-02 (×9): 600 mg via ORAL
  Filled 2021-12-29 (×11): qty 3

## 2021-12-29 MED ORDER — VANCOMYCIN HCL 1500 MG/300ML IV SOLN
1500.0000 mg | Freq: Once | INTRAVENOUS | Status: AC
  Administered 2021-12-29: 1500 mg via INTRAVENOUS
  Filled 2021-12-29: qty 300

## 2021-12-29 MED ORDER — CEFAZOLIN SODIUM-DEXTROSE 1-4 GM/50ML-% IV SOLN
1.0000 g | Freq: Once | INTRAVENOUS | Status: AC
  Administered 2021-12-29: 1 g via INTRAVENOUS
  Filled 2021-12-29: qty 50

## 2021-12-29 MED ORDER — ENOXAPARIN SODIUM 40 MG/0.4ML IJ SOSY
40.0000 mg | PREFILLED_SYRINGE | INTRAMUSCULAR | Status: DC
  Administered 2021-12-29 – 2022-01-01 (×4): 40 mg via SUBCUTANEOUS
  Filled 2021-12-29 (×5): qty 0.4

## 2021-12-29 MED ORDER — SODIUM CHLORIDE 0.9% FLUSH
3.0000 mL | Freq: Two times a day (BID) | INTRAVENOUS | Status: DC
  Administered 2021-12-29 – 2022-01-02 (×7): 3 mL via INTRAVENOUS

## 2021-12-29 MED ORDER — INSULIN ASPART 100 UNIT/ML IJ SOLN
0.0000 [IU] | Freq: Every day | INTRAMUSCULAR | Status: DC
  Administered 2021-12-29 – 2021-12-30 (×2): 4 [IU] via SUBCUTANEOUS
  Administered 2021-12-31: 2 [IU] via SUBCUTANEOUS

## 2021-12-29 MED ORDER — SIMVASTATIN 5 MG PO TABS
5.0000 mg | ORAL_TABLET | Freq: Every day | ORAL | Status: DC
  Administered 2021-12-29 – 2022-01-01 (×4): 5 mg via ORAL
  Filled 2021-12-29 (×5): qty 1

## 2021-12-29 MED ORDER — INSULIN ASPART 100 UNIT/ML IJ SOLN
0.0000 [IU] | Freq: Three times a day (TID) | INTRAMUSCULAR | Status: DC
  Administered 2021-12-29: 5 [IU] via SUBCUTANEOUS

## 2021-12-29 MED ORDER — OXYCODONE HCL 5 MG PO TABS
2.5000 mg | ORAL_TABLET | Freq: Once | ORAL | Status: AC
  Administered 2021-12-29: 2.5 mg via ORAL
  Filled 2021-12-29: qty 1

## 2021-12-29 MED ORDER — METFORMIN HCL 500 MG PO TABS: 500.0000 mg | ORAL_TABLET | Freq: Two times a day (BID) | ORAL | Status: DC

## 2021-12-29 MED ORDER — HYDROCODONE-ACETAMINOPHEN 5-325 MG PO TABS
1.0000 | ORAL_TABLET | Freq: Once | ORAL | Status: AC
  Administered 2021-12-29: 1 via ORAL
  Filled 2021-12-29: qty 1

## 2021-12-29 MED ORDER — INSULIN ASPART 100 UNIT/ML IJ SOLN
0.0000 [IU] | Freq: Three times a day (TID) | INTRAMUSCULAR | Status: DC
  Administered 2021-12-30: 5 [IU] via SUBCUTANEOUS
  Administered 2021-12-30 (×2): 8 [IU] via SUBCUTANEOUS
  Administered 2021-12-31: 11 [IU] via SUBCUTANEOUS
  Administered 2021-12-31: 8 [IU] via SUBCUTANEOUS
  Administered 2021-12-31: 3 [IU] via SUBCUTANEOUS
  Administered 2022-01-01: 8 [IU] via SUBCUTANEOUS

## 2021-12-29 MED ORDER — ACETAMINOPHEN 650 MG RE SUPP: 650.0000 mg | Freq: Four times a day (QID) | RECTAL | Status: DC | PRN

## 2021-12-29 MED ORDER — SODIUM CHLORIDE 0.9% FLUSH: 3.0000 mL | INTRAVENOUS | Status: DC | PRN

## 2021-12-29 MED ORDER — ACETAMINOPHEN 650 MG RE SUPP
650.0000 mg | Freq: Four times a day (QID) | RECTAL | Status: DC
  Filled 2021-12-29 (×4): qty 1

## 2021-12-29 MED ORDER — VANCOMYCIN HCL 1250 MG/250ML IV SOLN
1250.0000 mg | Freq: Two times a day (BID) | INTRAVENOUS | Status: DC
  Administered 2021-12-30 – 2022-01-01 (×6): 1250 mg via INTRAVENOUS
  Filled 2021-12-29 (×5): qty 250

## 2021-12-29 MED ORDER — IOHEXOL 350 MG/ML SOLN
100.0000 mL | Freq: Once | INTRAVENOUS | Status: AC | PRN
  Administered 2021-12-29: 100 mL via INTRAVENOUS

## 2021-12-29 NOTE — Progress Notes (Signed)
Pharmacy Antibiotic Note  Kyle Rubio is a 37 y.o. male admitted on 12/29/2021 presenting with worsening foot and ankle pain, swelling, redness, on PO abx PTA.  Pharmacy has been consulted for vancomycin dosing.  Plan: Vancomycin 1500 mg IV x 1, then 1250 mg IV q 12h (eAUC 523, SCr used 0.8) Monitor renal function, Cx and clinical progression to narrow Vancomycin levels as needed      Temp (24hrs), Avg:98.5 F (36.9 C), Min:98.2 F (36.8 C), Max:98.8 F (37.1 C)  Recent Labs  Lab 12/24/21 1508 12/29/21 0652  WBC 8.3 7.3  CREATININE 0.80 0.70  LATICACIDVEN 0.9  --     CrCl cannot be calculated (Unknown ideal weight.).    No Known Allergies  Daylene Posey, PharmD Clinical Pharmacist ED Pharmacist Phone # [phone] 12/29/2021 4:33 PM

## 2021-12-29 NOTE — Assessment & Plan Note (Addendum)
L foot erythema and pain have progressed despite keflex and doxycycline administration, though he has only taken 3 days of doxy course so far. No fevers, normal WBC, negative imaging reassuring. Will admit to [facility] attending Dr. Deirdre Priest for antibiotic treatment given progression and history of uncontrolled diabetes. -IV vancomycin per pharmacy to cover potential MRSA cellulitis -Ibuprofen 600 mg Q6 prn, tylenol 650 mg Q6 prn for pain -Vitals as per floor protocol to monitor for fever -Elevate foot given edema

## 2021-12-29 NOTE — Assessment & Plan Note (Signed)
New heart murmur, likely S4 gallop. DDx includes long-standing HTN, aortic stenosis, hypertrophic cardiomyopathy, and CAD. He remains asymptomatic. -Monitor status for now -Consider o/p echo for further evaluation

## 2021-12-29 NOTE — Hospital Course (Addendum)
Kyle Rubio is a 36 y.o. male with a history of type 2 diabetes mellitus, hyperlipidemia, hypertension who presented with left foot swelling and pain. Hospital course outlined by problem below:  Left ankle cellulitis Patient presented on 7/7 with progression of suspected cellulitis despite keflex and doxycycline administration. VSS, WBC normal, imaging unremarkable. He was given IV vancomycin d/t failure of outpatient therapy. Lesion extended beyond wound markings despite 48 hours of vanc. LE doppler US ordered which was unremarkable other than enlarged inguinal lymph nodes consistent with acute LE infection. Wound borders stopped extending after 72 hours of vanc. Vanc was switched to PO bactrim at discharge, at which point patient was stable with improving cellulitis and was ambulating better with crutches. Discharged with close PCP follow up.   Diabetes mellitus Patient's glucose on admission 258. A1c 14.1. Lipid panel WNL. He had started taking prescribed insulin 70/30 18u BID and metformin 500 mg BID two days before admission after 2 year loss to follow up. CBGs were monitored over admission and ranged 200s-300s. Over admission, he was titrated to 15u 70/30 insulin with 1000 mg metformin BID and given instructions to f/u regimen. Recommend close PCP follow up.   PCP follow-up items: Continued cellulitis improvement on abx. Patient to finish course of bactrim as prescribed. Skin marker boundary surrounding cellulitis should still be in place at follow up to aid assessment.  Diabetes regimen given difficult follow up. Discharged on 15 units 70/30 insulin and 1000 mg metformin BID.

## 2021-12-29 NOTE — Assessment & Plan Note (Addendum)
A1c 14.1. CBGs 200s-300s over admission. -Continue metformin 1000 mg BID with meals -Continue 70/30 insulin today (7/10) given preferred insulin regimen o/p; stop SSI -Consider initiating SGLT-2 or GLP-1, challenging given cost -Home simvastatin 5 mg QHS -Diabetes coordinator for education

## 2021-12-29 NOTE — ED Notes (Signed)
MD at bedside. 

## 2021-12-29 NOTE — ED Notes (Signed)
Patient transported to X-ray 

## 2021-12-29 NOTE — ED Provider Notes (Signed)
[facility] EMERGENCY DEPARTMENT Provider Note   CSN: [id number] Arrival date & time: 12/29/21  3244     History  Chief Complaint  Patient presents with   Foot Swelling    Kyle Rubio is a 36 y.o. male with a hx of diabetes mellitus who returns to the ED with complaints of worsening left foot/ankle pain/swelling.  Patient reports that he does have a chronic area of discoloration to the left anterior lateral ankle, however on 07/02 he noted some pain, swelling, and redness around the area.  He was seen in the emergency department 07/03 and started on Keflex to cover for possible cellulitis, symptoms continued to worsen therefore he was seen in outpatient clinic 12/27/2021 and started on doxycycline and Lasix.  He has been taking each of these medications as prescribed without improvement.  He states that he has not been given anything for pain and this is his main concern.  The swelling and discoloration has worsened.  He does not recall any specific injury.  He denies insect/neck/tick bite.  He denies any rashes.  Denies fever, chills, nausea, vomiting, abdominal pain, diarrhea, or dyspnea.  Interpreter utilized throughout encounter  HPI     Home Medications Prior to Admission medications   Medication Sig Start Date End Date Taking? Authorizing Provider  cephALEXin (KEFLEX) 500 MG capsule Take 1 capsule (500 mg total) by mouth 4 (four) times daily. 12/24/21   Renne Crigler, PA-C  ibuprofen (ADVIL) 600 MG tablet Take 1 tablet (600 mg total) by mouth every 6 (six) hours as needed for mild pain or moderate pain. 03/24/19   Antony Madura, PA-C  insulin aspart protamine-insulin aspart (NOVOLOG 70/30) (70-30) 100 UNIT/ML injection Inject 18 Units into the skin 2 (two) times daily with a meal. 09/21/11 09/20/12  Ghimire, Werner Lean, MD  metFORMIN (GLUCOPHAGE) 500 MG tablet Take 1 tablet (500 mg total) by mouth 2 (two) times daily with a meal. 12/24/21   Renne Crigler,  PA-C  simvastatin (ZOCOR) 5 MG tablet Take 1 tablet (5 mg total) by mouth at bedtime. 09/21/11 09/20/12  GhimireWerner Lean, MD      Allergies    Patient has no known allergies.    Review of Systems   Review of Systems  Constitutional:  Negative for chills and fever.  Respiratory:  Negative for shortness of breath.   Cardiovascular:  Negative for chest pain.  Gastrointestinal:  Negative for abdominal pain, nausea and vomiting.  Musculoskeletal:  Positive for arthralgias and joint swelling.  Skin:  Positive for color change. Negative for rash.  Neurological:  Negative for weakness and numbness.  All other systems reviewed and are negative.   Physical Exam Updated Vital Signs BP (!) 131/93 (BP Location: Right Arm)   Pulse 80   Temp 98.2 F (36.8 C) (Oral)   Resp 16   SpO2 99%  Physical Exam Vitals and nursing note reviewed.  Constitutional:      General: He is not in acute distress.    Appearance: He is well-developed. He is not ill-appearing or toxic-appearing.  HENT:     Head: Normocephalic and atraumatic.  Eyes:     General:        Right eye: No discharge.        Left eye: No discharge.     Conjunctiva/sclera: Conjunctivae normal.  Cardiovascular:     Rate and Rhythm: Normal rate and regular rhythm.     Pulses:  Dorsalis pedis pulses are 2+ on the right side and 2+ on the left side.       Posterior tibial pulses are 2+ on the right side and 2+ on the left side.  Pulmonary:     Effort: Pulmonary effort is normal. No respiratory distress.     Breath sounds: Normal breath sounds. No wheezing or rales.  Abdominal:     General: There is no distension.     Palpations: Abdomen is soft.     Tenderness: There is no abdominal tenderness.  Musculoskeletal:     Cervical back: Neck supple.     Comments: Lower extremities: Patient has edema to the mid/forefoot and the lateral ankle with discoloration somewhat ecchymotic in appearance, some mild erythema.  Not hot to the  touch.  Limited active range of motion of the left ankle, able to range the digits and the knee without difficulty.  I am able to dorsiflex the ankle passively without pain, however when I plantarflex the patient's ankle passively he does have discomfort with this.  He is tender to palpation to the lateral aspect of the forefoot, midfoot, and the lateral malleolus/diffuse lateral ankle.  He is also tender over the fourth and fifth digits.  Otherwise nontender.  No calf tenderness.  No fibular head tenderness.  Compartments are soft.  Skin:    General: Skin is warm and dry.     Capillary Refill: Capillary refill takes less than 2 seconds.  Neurological:     Mental Status: He is alert.     Deep Tendon Reflexes: Reflexes abnormal.     Comments: Alert. Clear speech. Sensation grossly intact to bilateral lower extremities.  Able to plantar/dorsiflex against resistance.  Psychiatric:        Mood and Affect: Mood normal.        Behavior: Behavior normal.          ED Results / Procedures / Treatments   Labs (all labs ordered are listed, but only abnormal results are displayed) Labs Reviewed  COMPREHENSIVE METABOLIC PANEL - Abnormal; Notable for the following components:      Result Value   Glucose, Bld 258 (*)    Albumin 3.2 (*)    AST 11 (*)    All other components within normal limits  CBC WITH DIFFERENTIAL/PLATELET    EKG None  Radiology CT ANKLE LEFT W CONTRAST  Result Date: 12/29/2021 CLINICAL DATA:  Ankle pain and swelling. EXAM: CT OF THE LEFT ANKLE WITH CONTRAST TECHNIQUE: Multidetector CT imaging of the left ankle was performed following the standard protocol during bolus administration of intravenous contrast. Due to equipment malfunction, imaging was performed before contrast injection. The exam was repeated after contrast administration. The patient was not charged for the noncontrast portion of the study. RADIATION DOSE REDUCTION: This exam was performed according to the  departmental dose-optimization program which includes automated exposure control, adjustment of the mA and/or kV according to patient size and/or use of iterative reconstruction technique. CONTRAST:  OMNIPAQUE IOHEXOL 350 MG/ML SOLN COMPARISON:  Radiographs 12/29/2021 and 12/24/2021. FINDINGS: Bones/Joint/Cartilage No evidence of acute fracture or dislocation. There is no bone destruction. The joint spaces are preserved, and there is no significant ankle joint effusion or abnormal synovial enhancement. The visualized tarsal bones appear unremarkable. Probable incidental subchondral cyst within the cuboid. Ligaments Suboptimally assessed by CT. Muscles and Tendons The ankle tendons appear intact as evaluated by CT. No significant tenosynovitis or abnormal enhancement. No intramuscular fluid collection or abnormal enhancement. Soft  tissues There is generalized subcutaneous edema surrounding the ankle, greatest laterally. There appears to be some lateral dermal thickening without focal wound. No drainable fluid collection, foreign body or soft tissue emphysema identified. There is an apparent overlying ice bag. IMPRESSION: 1. Soft tissue swelling around the ankle without focal fluid collection suspicious for soft tissue infection (cellulitis); correlate clinically. No evidence of abscess. 2. No evidence of osteomyelitis or septic arthritis. 3. No acute osseous findings. Electronically Signed   By: Carey Bullocks M.D.   On: 12/29/2021 11:59   DG Foot Complete Left  Result Date: 12/29/2021 CLINICAL DATA:  Foot radiograph December 24, 2021 and ankle radiograph March 23, 2019 EXAM: LEFT FOOT - COMPLETE 3+ VIEW COMPARISON:  Foot radiograph December 24, 2021 and ankle radiograph March 23, 2019. FINDINGS: There is no evidence of fracture or dislocation. Dorsal midfoot degenerative change. No radiopaque foreign body. IMPRESSION: No acute osseous abnormality and no radiopaque foreign body identified. Electronically  Signed   By: Maudry Mayhew M.D.   On: 12/29/2021 09:51   DG Ankle Complete Left  Result Date: 12/29/2021 CLINICAL DATA:  Foot/ankle pain and swelling. EXAM: LEFT ANKLE COMPLETE - 3+ VIEW COMPARISON:  Foot radiograph December 24, 2021 and ankle radiograph March 23, 2019 FINDINGS: There is no evidence of fracture, dislocation, or joint effusion. Well corticated ossific density along the lateral aspect of the distal fibula is present and unchanged dating back to radiograph March 23, 2019 possibly reflecting sequela of prior trauma. Mild dorsal midfoot degenerative spurring. No radiopaque foreign body. IMPRESSION: No acute osseous abnormality and no radiopaque foreign body identified. Electronically Signed   By: Maudry Mayhew M.D.   On: 12/29/2021 09:49    Procedures Procedures    Medications Ordered in ED Medications - No data to display  ED Course/ Medical Decision Making/ A&P                           Medical Decision Making Amount and/or Complexity of Data Reviewed Labs: ordered. Radiology: ordered.  Risk Prescription drug management. Decision regarding hospitalization.  Patient presents to the ED with complaints of worsening left foot/ankle pain/swelling/discoloration, this involves an extensive number of treatment options, and is a complaint that carries with it a high risk of complications and morbidity. Nontoxic, vitals with mild hypertension on arrival. .   DDx including but not limited to: Fracture, ligamentous strain, cellulitis, erysipelas, septic joint, osteomyelitis, DVT.   Additional history obtained:  Chart/nursing notes reviewed.  External records viewed including photo of patient's foot/ankle from 12/25/2021.  Lab Tests:  I viewed & interpreted labs including:  CBC: Unremarkable CMP: Hyperglycemia without acidosis or anion gap elevation.  Imaging Studies:  I ordered and viewed the following imaging, agree with radiologist impression:  Left foot xray: No acute  osseous abnormality and no radiopaque foreign body identified. Left ankle xray: No acute osseous abnormality and no radiopaque foreign body identified.  CT left ankle: 1. Soft tissue swelling around the ankle without focal fluid collection suspicious for soft tissue infection (cellulitis); correlate clinically. No evidence of abscess. 2. No evidence of osteomyelitis or septic arthritis. 3. No acute osseous findings.  ED Course:  Imaging without fracture or radiopaque foreign body.  No fluid collection to suggest abscess.  No CT evidence of osteomyelitis or septic arthritis.  Limb is well-perfused, do not suspect acute ischemia.  Edema and skin changes are localized to the foot and the lateral ankle, no calf tenderness, DVT  seems less likely. Discussed findings and plan of care with supervising physician Dr. Manus Gunning who has evaluated the patient- recommends admission for failed outpatient cellulitis which I am in agreement with. Cefazolin initiated.  Discussed findings and plan of care with patient and his wife at bedside, they are in agreement.  Discussed with family medicine residency service, will evaluate the patient for admission.   Portions of this note were generated with Scientist, clinical (histocompatibility and immunogenetics). Dictation errors may occur despite best attempts at proofreading.  Final Clinical Impression(s) / ED Diagnoses Final diagnoses:  Cellulitis of left lower extremity    Rx / DC Orders ED Discharge Orders     None         Cherly Anderson, PA-C 12/29/21 1454    Glynn Octave, MD 12/29/21 1554

## 2021-12-29 NOTE — ED Triage Notes (Signed)
Pt reported to ED with c/o foot swelling x past few weeks. Foot appears swollen with redness and discoloration around ankle. Pt denies any trauma to extremity. Pt states that he was seen in this ED a few days ago and was told to return if foot did not get better. Pt states that foot has worsened.

## 2021-12-29 NOTE — H&P (Cosign Needed Addendum)
Hospital Admission History and Physical Service Pager: [phone]  Patient name: Kyle Rubio Medical record number: [id number] Date of Birth: [birth year] Age: 36 y.o. Gender: male  Primary Care Provider: Pcp, No Consultants: None Code Status: Full  Preferred Emergency Contact: Sixtos,Maria (Spouse), [phone] (Home)  Chief Complaint: L foot redness and swelling  Assessment and Plan: Bryon Parker is a 36 y.o. male presenting with left foot swelling and redness refractory to outpatient antibiotics. Differential for this patient's presentation of this includes cellulitis, erysipelas, lymphangitis, underlying osteomyelitis, and DVT. Cellulitis or erysipelas most likely, possibly due to MRSA, given appearance and area of localization. Lymphangitis less likely given absence of lymphatic tract inflammation on exam and imaging. Osteo less likely given reassuring CT findings. DVT less likely due to localized nature of erythema and swelling, overall reassuring ROM, and lack of findings on CT.  * Cellulitis L foot erythema and pain have progressed despite keflex and doxycycline administration, though he has only taken 3 days of doxy course so far. No fevers, normal WBC, negative imaging reassuring. Will admit to FMTS attending Dr. Deirdre Priest for antibiotic treatment given progression and history of uncontrolled diabetes. -IV vancomycin per pharmacy to cover potential MRSA cellulitis -Ibuprofen 600 mg Q6 prn, tylenol 650 mg Q6 prn for pain -Vitals as per floor protocol to monitor for fever -Elevate foot given edema  Diabetes (HCC) Uncontrolled. Back on insulin and metformin for 2 days after 2 years lost to f/u. Glc on BMP 258 today (7/7); it was 571 on presentation to ED 7/3. -Insulin aspart mSSI, assess need and calculate basal tomorrow -Home metformin 500 mg BID with meals -Home simvastatin 5 mg QHS -A1c  Heart murmur New heart murmur, likely S4 gallop. DDx  includes long-standing HTN, aortic stenosis, hypertrophic cardiomyopathy, and CAD. BP hypertensive on admission though normalized after. -Monitor status for now, consider echo and EKG   FEN/GI: Carb-modified VTE Prophylaxis: Lovenox  Disposition: Med-surg  History of Present Illness:  Kyle Rubio is a 36 y.o. male with a history of uncontrolled diabetes mellitus presenting with L foot redness and swelling.  Cailean mentions this began one week ago. It has since spread from the initial site on top of his foot to the lateral area of his L foot. The swelling and pain have also progressed. The pain is worse in the mornings and when his foot dangles from the bed. 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 He has been taking his keflex (day 4, from initial ED visit) and doxycycline (day 2, from later clinic visit) as prescribed with continued progression which prompted him to come to the ED.  In the ED, he was given one dose of ancef 1 g and one tablet norco for pain. XR and CT overall unremarkable except for evidence of soft tissue infection. No evidence of osteomyelitis.   Review Of Systems: Per HPI  Pertinent Past Medical History: Diabetes not consistently on insulin/metformin Remainder reviewed in history tab.   Pertinent Past Surgical History: None   Pertinent Social History: Tobacco use: Socially, 4 cigarettes per week Alcohol use: Socially on weekends Other Substance use: None Lives with wife, three children  Pertinent Family History: Reviewed in history tab.   Important Outpatient Medications: Restarted 70/30 insulin 18u BID and metformin 500 mg BID for first time in 2 years this last Wednesday (7/5).   Objective: BP 111/80 (BP Location: Right Arm)  Pulse 72   Temp 98.8 F (37.1 C) (Oral)   Resp 18   SpO2 99%   Exam: General: Sitting up in bed, in NAD, conversant with daughter as interpreter present Eyes: Anicteric sclera, EOMI grossly Neck: Grossly normal ROM Cardiovascular: RRR, S4 gallop appreciated throughout, 2+ pitting edema of L foot and ankle Respiratory: CTAB Gastrointestinal: Soft, non-tender, normoactive bowel sounds MSK: Normal passive and active ROM of L ankle Derm: Erythema overlying the mid-dorsum to lateral aspect of L foot with some advancement to anterior and posterior ankle; erythema also overlying hyperpigmented scar seen below on anterolateral dorsum Neuro: Strength and sensation intact in L extremity Psych: Alert and oriented, euthymic mood and affect         Labs:  CBC BMET  Recent Labs  Lab 12/29/21 0652  WBC 7.3  HGB 13.6  HCT 41.1  PLT 337   Recent Labs  Lab 12/29/21 0652  NA 139  K 4.6  CL 102  CO2 25  BUN 14  CREATININE 0.70  GLUCOSE 258*  CALCIUM 9.0     Imaging Studies Performed: XR L ankle: IMPRESSION: No acute osseous abnormality and no radiopaque foreign body identified.  XR L foot: IMPRESSION: No acute osseous abnormality and no radiopaque foreign body identified.   CT L ankle: IMPRESSION: 1. Soft tissue swelling around the ankle without focal fluid collection suspicious for soft tissue infection (cellulitis); correlate clinically. No evidence of abscess. 2. No evidence of osteomyelitis or septic arthritis. 3. No acute osseous findings.  Janeal Holmes, MD 12/29/2021, 4:30 PM PGY-1, [facility] Family [facility] FPTS Intern pager: [phone], text pages welcome Secure chat group [facility] Teaching Service   Upper Level Addendum: I have seen and evaluated this patient along with Dr. Phineas Real and reviewed the above note, making necessary revisions as appropriate. I agree with the medical decision making and physical exam as noted above. Fayette Pho, MD PGY-3 [facility] Family Medicine Residency

## 2021-12-30 DIAGNOSIS — E1169 Type 2 diabetes mellitus with other specified complication: Secondary | ICD-10-CM

## 2021-12-30 LAB — HEMOGLOBIN A1C
Hgb A1c MFr Bld: 14.1 % — ABNORMAL HIGH (ref 4.8–5.6)
Mean Plasma Glucose: 357.97 mg/dL

## 2021-12-30 LAB — GLUCOSE, CAPILLARY
Glucose-Capillary: 310 mg/dL — ABNORMAL HIGH (ref 70–99)
Glucose-Capillary: 247 mg/dL — ABNORMAL HIGH (ref 70–99)
Glucose-Capillary: 286 mg/dL — ABNORMAL HIGH (ref 70–99)
Glucose-Capillary: 254 mg/dL — ABNORMAL HIGH (ref 70–99)
Glucose-Capillary: 327 mg/dL — ABNORMAL HIGH (ref 70–99)

## 2021-12-30 LAB — LIPID PANEL
Cholesterol: 154 mg/dL (ref 0–200)
HDL: 44 mg/dL (ref >40)
LDL Cholesterol: 87 mg/dL (ref 0–99)
Total CHOL/HDL Ratio: 3.5 RATIO
Triglycerides: 114 mg/dL (ref <150)
VLDL: 23 mg/dL (ref 0–40)

## 2021-12-30 LAB — HIV ANTIBODY (ROUTINE TESTING W REFLEX): HIV Screen 4th Generation wRfx: NONREACTIVE

## 2021-12-30 MED ORDER — METFORMIN HCL 500 MG PO TABS
1000.0000 mg | ORAL_TABLET | Freq: Two times a day (BID) | ORAL | Status: DC
  Administered 2021-12-31 – 2022-01-02 (×5): 1000 mg via ORAL
  Filled 2021-12-30 (×5): qty 2

## 2021-12-30 MED ORDER — LIVING WELL WITH DIABETES BOOK - IN SPANISH
Freq: Once | Status: AC
  Filled 2021-12-30: qty 1

## 2021-12-30 NOTE — Evaluation (Signed)
Physical Therapy Evaluation Patient Details Name: Kyle Rubio MRN: [id number] DOB: 19-Oct-1985 Today's Date: 12/30/2021  History of Present Illness  Pt is a 36 y.o. male who presented 12/29/21 with worsening L foot/ankle pain/swelling after presenting to ED for the same issue 12/24/21. Pt admitted with L foot cellulitis. No evidence of ostemoyelitis or acute osseous findings with imaging. PMH: DM   Clinical Impression  Pt presents with condition above and deficits mentioned below, see PT Problem List. PTA, he was IND without DME, working in [employer], and living with his family in a house with several STE. Currently, pt is primarily limited in mobility by his L ankle/foot pain. His edema is limiting his L ankle/foot ROM also, impacting his gait pattern. In addition, he has increased sensitivity to touch at the lateral aspect of his ankle. Despite these limitations, pt is able to mobilize without assistance or LOB, but with a decreased speed and with gait deviations and mild imbalance. Pt educated on stair negotiation, protecting his limb at work, managing the edema, and improving his ROM. I anticipate pt will progress well as the pain and edema improve. Will continue to follow acutely to maximize his return to baseline prior to d/c home.     Recommendations for follow up therapy are one component of a multi-disciplinary discharge planning process, led by the attending physician.  Recommendations may be updated based on patient status, additional functional criteria and insurance authorization.  Follow Up Recommendations No PT follow up      Assistance Recommended at Discharge PRN  Patient can return home with the following  Assist for transportation;Assistance with cooking/housework    Equipment Recommendations None recommended by PT  Recommendations for Other Services       Functional Status Assessment Patient has had a recent decline in their functional status and  demonstrates the ability to make significant improvements in function in a reasonable and predictable amount of time.     Precautions / Restrictions Precautions Precautions: None Restrictions Weight Bearing Restrictions: No      Mobility  Bed Mobility Overal bed mobility: Modified Independent             General bed mobility comments: Able to come to sit EOB with HOB elevated    Transfers Overall transfer level: Needs assistance Equipment used: None Transfers: Sit to/from Stand Sit to Stand: Supervision           General transfer comment: Supervision for safety, slow to power up to stand and bring L foot medially and posteriorly to R, no LOB    Ambulation/Gait Ambulation/Gait assistance: Supervision Gait Distance (Feet): 160 Feet Assistive device: None Gait Pattern/deviations: Step-to pattern, Decreased stance time - left, Decreased step length - right, Decreased dorsiflexion - left, Decreased weight shift to left, Antalgic, Trunk flexed Gait velocity: reduced Gait velocity interpretation: 1.31 - 2.62 ft/sec, indicative of limited community ambulator   General Gait Details: Pt with decreased speed and antalgic step-to gait pattern with inferior gaze and rounded shoulders. Verbal and tactile cues provided to improve upright posture. No LOB, increased L weight bearing as distance progressed, but still decreased dorsiflexion noted. Supervision for safety  Stairs Stairs: Yes Stairs assistance: Min guard Stair Management: One rail Left, Step to pattern, Forwards Number of Stairs: 2 General stair comments: Ascends and descends with L rail, cuing pt to lead up with R foot and down with L foot for increased ease and pain management, good success noted. No LOB, min guard for safety  Wheelchair Mobility  Modified Rankin (Stroke Patients Only)       Balance Overall balance assessment: Mild deficits observed, not formally tested                                            Pertinent Vitals/Pain Pain Assessment Pain Assessment: 0-10 Pain Score: 6  Pain Location: L foot/ankle Pain Descriptors / Indicators: Discomfort, Grimacing, Guarding Pain Intervention(s): Monitored during session, Limited activity within patient's tolerance, Repositioned    Home Living Family/patient expects to be discharged to:: Private residence Living Arrangements: Spouse/significant other;Children (x4 children) Available Help at Discharge: Family;Available 24 hours/day Type of Home: House Home Access: Stairs to enter Entrance Stairs-Rails: Can reach both Entrance Stairs-Number of Steps:  (several)   Home Layout: One level Home Equipment: None      Prior Function Prior Level of Function : Independent/Modified Independent;Driving;Working/employed             Mobility Comments: Works in Restaurant manager, fast food        Extremity/Trunk Assessment   Upper Extremity Assessment Upper Extremity Assessment: Overall WFL for tasks assessed    Lower Extremity Assessment Lower Extremity Assessment: LLE deficits/detail LLE Deficits / Details: Noted edema and bruising around L foot/ankle, more so laterally; limited ankle ROM in all directions, more so in dorsiflexion, achieving neutral dorsiflexion AAROM; increased sensativity/tingling at lateral ankle/foot, intact elsewhere LLE Sensation: decreased light touch    Cervical / Trunk Assessment Cervical / Trunk Assessment: Normal  Communication   Communication: Prefers language other than English;Other (comment);Interpreter utilized (Spanish)  Cognition Arousal/Alertness: Awake/alert Behavior During Therapy: WFL for tasks assessed/performed Overall Cognitive Status: Within Functional Limits for tasks assessed                                          General Comments General comments (skin integrity, edema, etc.): educated pt to wear his tall boots at work for  safety/protection and to elevate leg at rest and perform ankle pumps and ankle dorsiflexion PROM using sheet to improve edema and ROM    Exercises     Assessment/Plan    PT Assessment Patient needs continued PT services  PT Problem List Decreased range of motion;Decreased activity tolerance;Decreased balance;Decreased mobility;Impaired sensation;Pain       PT Treatment Interventions DME instruction;Gait training;Stair training;Functional mobility training;Therapeutic activities;Therapeutic exercise;Balance training;Neuromuscular re-education;Patient/family education    PT Goals (Current goals can be found in the Care Plan section)  Acute Rehab PT Goals Patient Stated Goal: to get better PT Goal Formulation: With patient/family Time For Goal Achievement: 01/06/22 Potential to Achieve Goals: Good    Frequency Min 3X/week     Co-evaluation               AM-PAC PT "6 Clicks" Mobility  Outcome Measure Help needed turning from your back to your side while in a flat bed without using bedrails?: None Help needed moving from lying on your back to sitting on the side of a flat bed without using bedrails?: None Help needed moving to and from a bed to a chair (including a wheelchair)?: A Little Help needed standing up from a chair using your arms (e.g., wheelchair or bedside chair)?: A Little Help needed to walk in hospital room?: A Little Help needed  climbing 3-5 steps with a railing? : A Little 6 Click Score: 20    End of Session   Activity Tolerance: Patient tolerated treatment well Patient left: in chair;with call bell/phone within reach;with family/visitor present Nurse Communication: Mobility status PT Visit Diagnosis: Unsteadiness on feet (R26.81);Other abnormalities of gait and mobility (R26.89);Difficulty in walking, not elsewhere classified (R26.2);Pain Pain - Right/Left: Left Pain - part of body: Ankle and joints of foot    Time: 1540-0867 PT Time Calculation  (min) (ACUTE ONLY): 22 min   Charges:   PT Evaluation $PT Eval Low Complexity: 1 Low          Raymond Gurney, PT, DPT Acute Rehabilitation Services  Office: [phone]   Jewel Baize 12/30/2021, 10:52 AM

## 2021-12-30 NOTE — Progress Notes (Addendum)
     Daily Progress Note Intern Pager: [phone]  Patient name: Kyle Rubio Medical record number: [id number] Date of birth: [birth year] Age: 36 y.o. Gender: male  Primary Care Provider: Mechele Dawley, PA-C Consultants: None Code Status: Full  Pt Overview and Major Events to Date:  7/7: Admitted, IV Vancomycin started  Assessment and Plan: Kyle Rubio is a 36 year-old male who presented with left foot swelling and discoloration likely 2/2 cellulitis that progressed despite outpatient antibiotics. Less likely lypmhangitis and osteomyelitis given lack of findings on CT. Pertinent PMH/PSH includes T2DM, HLD.   * Cellulitis Improving. Day #2 IV Vancomycin, will continue until 7/9 and transition to PO antibiotic if continued improvement clinically. -IV vancomycin (start 7/7- current) -Ibuprofen 600 mg Q6 prn, tylenol 650 mg Q6 prn for pain -Vitals as per floor protocol to monitor for fever -Elevate foot given edema  Diabetes (HCC) A1c 14.1. CBGs 200s-300s. Received 14u short-acting insulin since admission.  Back on insulin and metformin for 2 days after 2 years lost to f/u. Discussed association with diabetes and increased risk of infection/CVA/MI with patient.  -Insulin aspart mSSI, assess need and calculate basal  -Increase home metformin to 1000 mg BID with meals - Consider initiating SGLT-2 or GLP-1, challenging given cost -Home simvastatin 5 mg QHS -Diabetes coordinator for education  Heart murmur New heart murmur, likely S4 gallop. DDx includes long-standing HTN, aortic stenosis, hypertrophic cardiomyopathy, and CAD. BP hypertensive on admission though normalized after. -Monitor status for now, consider echo and EKG   FEN/GI: Carb-modified PPx: Lovenox Dispo:Pending PT recommendations  pending clinical improvement . Barriers include continued IV antibiotic management.   Subjective:  Used phone Spanish interpreter.  Feeling well. Pain is  3-4/10 with laying down, 6-7/10 when standing.  Swelling and redness much improved. No abdominal pain, calf pain, or other complaints. Eating and drinking well. No nausea or vomiting.  Objective: Temp:  [97.6 F (36.4 C)-98.8 F (37.1 C)] 98.3 F (36.8 C) (07/08 0734) Pulse Rate:  [55-66] 56 (07/08 0734) Resp:  [16-17] 17 (07/08 0734) BP: (100-108)/(63-79) 106/71 (07/08 0734) SpO2:  [96 %-100 %] 100 % (07/08 0734) Weight:  [68 kg] 68 kg (07/07 1714) Physical Exam: General: Sitting up in bed, NAD Cardiovascular: RRR, no murmurs Respiratory: Normal WOB on room air, no wheezing or crackles Abdomen: Soft, NTND Extremities: Left ankle with 2+ non-pitting swelling, warmth, TTP and continued erythema but much improved from yesterday. Normal strength and sensation in BLLE  Laboratory: Most recent CBC Lab Results  Component Value Date   WBC 7.3 12/29/2021   HGB 13.6 12/29/2021   HCT 41.1 12/29/2021   MCV 86.9 12/29/2021   PLT 337 12/29/2021   Most recent BMP    Latest Ref Rng & Units 12/29/2021    6:52 AM  BMP  Glucose 70 - 99 mg/dL 025   BUN 6 - 20 mg/dL 14   Creatinine 4.27 - 1.24 mg/dL 0.62   Sodium 376 - 283 mmol/L 139   Potassium 3.5 - 5.1 mmol/L 4.6   Chloride 98 - 111 mmol/L 102   CO2 22 - 32 mmol/L 25   Calcium 8.9 - 10.3 mg/dL 9.0       Imaging/Diagnostic Tests: Radiologist Impression: No results found.   Darral Dash, DO 12/30/2021, 2:48 PM  PGY-2, [facility] FPTS Intern pager: [phone], text pages welcome Secure chat group [facility] Teaching Service

## 2021-12-30 NOTE — Inpatient Diabetes Management (Signed)
Spoke to patient with interpreter [phone] on the phone about his diabetes. Spouse was also in the room with him. He states that he was diagnosed about 9 years ago. He was on insulin for about 6 months and then taken off due to good control. Continued on Metformin at that time. He last saw his PCP on Wednesday 12/27/21. He takes the Novolin Rel[facility] 70/30 insulin with vial and syringe that he can purchase for $25 per bottle. He states that he takes 70/30 insulin 30 units BID, but has only been taking it 3 times per week. He does have a home blood glucose meter, but has not been using it.   Discussed the A1C of 14.1% which seemed to surprise him as being so high. Noted that his blood sugars have been running over 350 mg/dl on an average over the last 2-3 months. He stated that he had not been taking good care of himself.   Will order Living Well with Diabetes booklet in Spanish for him and other information to help with his DM education. Will continue to monitor blood sugars while in the hospital.  Smith Mince RN BSN CDE Diabetes Coordinator Pager: [phone]  8am-5pm

## 2021-12-30 NOTE — Inpatient Diabetes Management (Signed)
Inpatient Diabetes Program Recommendations  AACE/ADA: New Consensus Statement on Inpatient Glycemic Control (2015)  Target Ranges:  Prepandial:   less than 140 mg/dL      Peak postprandial:   less than 180 mg/dL (1-2 hours)      Critically ill patients:  140 - 180 mg/dL   Lab Results  Component Value Date   GLUCAP 247 (H) 12/30/2021   HGBA1C 14.1 (H) 12/30/2021    Latest Reference Range & Units 12/29/21 17:13 12/29/21 22:24 12/30/21 05:09 12/30/21 07:37  Glucose-Capillary 70 - 99 mg/dL 160 (H) 737 (H) 106 (H) 247 (H)  (H): Data is abnormally high Review of Glycemic Control  Diabetes history: DM 2 Outpatient Diabetes medications: Novolog 70/30 insulin 18 units BID, Metformin 500 mg BID Current orders for Inpatient glycemic control: Novolog MODERATE correction scale TID & 0-5 units HS scale  Inpatient Diabetes Program Recommendations:   Noted that patient has been on 70/30 insulin 18 units BID very recently at home.  Recommend adding Semglee 15 units daily and continue Novolog correction scales as ordered. Titrate dosages as needed.  Will continue to monitor blood sugars while in the hospital.  Smith Mince RN BSN CDE Diabetes Coordinator Pager: [phone]  8am-5pm

## 2021-12-31 DIAGNOSIS — E1169 Type 2 diabetes mellitus with other specified complication: Secondary | ICD-10-CM

## 2021-12-31 DIAGNOSIS — R011 Cardiac murmur, unspecified: Secondary | ICD-10-CM

## 2021-12-31 DIAGNOSIS — E785 Hyperlipidemia, unspecified: Secondary | ICD-10-CM

## 2021-12-31 LAB — BASIC METABOLIC PANEL
Anion gap: 8 (ref 5–15)
BUN: 15 mg/dL (ref 6–20)
CO2: 26 mmol/L (ref 22–32)
Calcium: 8.8 mg/dL — ABNORMAL LOW (ref 8.9–10.3)
Chloride: 104 mmol/L (ref 98–111)
Creatinine, Ser: 0.63 mg/dL (ref 0.61–1.24)
GFR, Estimated: >60 mL/min (ref >60)
Glucose, Bld: 248 mg/dL — ABNORMAL HIGH (ref 70–99)
Potassium: 4.2 mmol/L (ref 3.5–5.1)
Sodium: 138 mmol/L (ref 135–145)

## 2021-12-31 LAB — CBC
HCT: 34.2 % — ABNORMAL LOW (ref 39.0–52.0)
Hemoglobin: 11.5 g/dL — ABNORMAL LOW (ref 13.0–17.0)
MCH: 28.7 pg (ref 26.0–34.0)
MCHC: 33.6 g/dL (ref 30.0–36.0)
MCV: 85.3 fL (ref 80.0–100.0)
Platelets: 311 10*3/uL (ref 150–400)
RBC: 4.01 MIL/uL — ABNORMAL LOW (ref 4.22–5.81)
RDW: 11.5 % (ref 11.5–15.5)
WBC: 6.2 10*3/uL (ref 4.0–10.5)
nRBC: 0 % (ref 0.0–0.2)

## 2021-12-31 LAB — GLUCOSE, CAPILLARY
Glucose-Capillary: 269 mg/dL — ABNORMAL HIGH (ref 70–99)
Glucose-Capillary: 297 mg/dL — ABNORMAL HIGH (ref 70–99)
Glucose-Capillary: 171 mg/dL — ABNORMAL HIGH (ref 70–99)
Glucose-Capillary: 217 mg/dL — ABNORMAL HIGH (ref 70–99)
Glucose-Capillary: 305 mg/dL — ABNORMAL HIGH (ref 70–99)
Glucose-Capillary: 203 mg/dL — ABNORMAL HIGH (ref 70–99)

## 2021-12-31 MED ORDER — INSULIN ASPART PROT & ASPART (70-30 MIX) 100 UNIT/ML ~~LOC~~ SUSP
10.0000 [IU] | Freq: Two times a day (BID) | SUBCUTANEOUS | Status: DC
  Administered 2022-01-01: 10 [IU] via SUBCUTANEOUS
  Filled 2021-12-31 (×2): qty 10

## 2021-12-31 MED ORDER — INSULIN GLARGINE-YFGN 100 UNIT/ML ~~LOC~~ SOLN: 10.0000 [IU] | Freq: Every day | SUBCUTANEOUS | Status: DC

## 2021-12-31 NOTE — Progress Notes (Signed)
Physical Therapy Treatment Patient Details Name: Kyle Rubio MRN: [id number] DOB: 09/26/1985 Today's Date: 12/31/2021   History of Present Illness Pt is a 36 y.o. male who presented 12/29/21 with worsening L foot/ankle pain/swelling after presenting to ED for the same issue 12/24/21. Pt admitted with L foot cellulitis. No evidence of ostemoyelitis or acute osseous findings with imaging. PMH: DM    PT Comments    Continuing work on functional mobility and activity tolerance;  Atticus was in a considerable amount of pain today, and noted increased swelling and erythema L ankle;  Medical Resident came in during session and is aware; Discussed possibility that perhaps pt "overdid" it yesterday, but it is still important to keep moving within pain tolerance; performed ROM of ankle in all planes within tolerance; Very painful with L foot and ankle in dependent position, and especially painful when foot touches the floor; Session then focused on options for still being able to walk/move and respect pain -- practiced with crutches, and pt maintained NWB LLE well while walking to and from bathroom; Updated equipment recs to getting him crutches -- and will still monitor for progress; Anticipate good progress once he is medically better and in less pain;   Video Interpreter, Jesus, [phone], present for session and facilitated clear communication  Recommendations for follow up therapy are one component of a multi-disciplinary discharge planning process, led by the attending physician.  Recommendations may be updated based on patient status, additional functional criteria and insurance authorization.  Follow Up Recommendations  No PT follow up     Assistance Recommended at Discharge PRN  Patient can return home with the following Assist for transportation;Assistance with cooking/housework   Equipment Recommendations  Crutches    Recommendations for Other Services       Precautions /  Restrictions Precautions Precautions: None Restrictions Weight Bearing Restrictions: No     Mobility  Bed Mobility Overal bed mobility: Independent             General bed mobility comments: No difficutly getting up to EOB    Transfers Overall transfer level: Needs assistance Equipment used: Crutches Transfers: Sit to/from Stand Sit to Stand: Supervision           General transfer comment: Supervision for safety; Cues for crutch management    Ambulation/Gait Ambulation/Gait assistance: Min guard (without physical contact) Gait Distance (Feet): 20 Feet Assistive device: Crutches Gait Pattern/deviations:  (3 point gait with crutches) Gait velocity: reduced     General Gait Details: L foot VERY painful in dependent position, and more painful when touching the floor; Opted to get up with crutches to allow for walking and keeping NWB for now to minimize pain; kept balance with crutches overall well; pain limited gait distance   Stairs             Wheelchair Mobility    Modified Rankin (Stroke Patients Only)       Balance Overall balance assessment: Mild deficits observed, not formally tested                                          Cognition Arousal/Alertness: Awake/alert Behavior During Therapy: WFL for tasks assessed/performed Overall Cognitive Status: Within Functional Limits for tasks assessed  Exercises      General Comments General comments (skin integrity, edema, etc.): More swollen, erythematous, and painful today; Discussed possibility that perhaps pt "overdid" it yesterday, but it is still important to keep moving within pain tolerance; performed ROM of ankle in all planes within tolerance      Pertinent Vitals/Pain Pain Assessment Pain Assessment: 0-10 Pain Score: 5  (5 at rest, 7 with foot in dependent position) Pain Location: L foot/ankle Pain Descriptors  / Indicators: Discomfort, Grimacing, Guarding Pain Intervention(s): Monitored during session, Repositioned, Other (comment) (elevated LLE)    Home Living                          Prior Function            PT Goals (current goals can now be found in the care plan section) Acute Rehab PT Goals Patient Stated Goal: to get better PT Goal Formulation: With patient/family Time For Goal Achievement: 01/06/22 Potential to Achieve Goals: Good Progress towards PT goals: Progressing toward goals (slowly, limited by pain today)    Frequency    Min 3X/week      PT Plan Current plan remains appropriate;Equipment recommendations need to be updated    Co-evaluation              AM-PAC PT "6 Clicks" Mobility   Outcome Measure  Help needed turning from your back to your side while in a flat bed without using bedrails?: None Help needed moving from lying on your back to sitting on the side of a flat bed without using bedrails?: None Help needed moving to and from a bed to a chair (including a wheelchair)?: A Little Help needed standing up from a chair using your arms (e.g., wheelchair or bedside chair)?: A Little Help needed to walk in hospital room?: A Little Help needed climbing 3-5 steps with a railing? : A Little 6 Click Score: 20    End of Session   Activity Tolerance: Patient limited by pain Patient left: in chair;with call bell/phone within reach;with family/visitor present;Other (comment) (and LLE elevated) Nurse Communication: Mobility status PT Visit Diagnosis: Unsteadiness on feet (R26.81);Other abnormalities of gait and mobility (R26.89);Difficulty in walking, not elsewhere classified (R26.2);Pain Pain - Right/Left: Left Pain - part of body: Ankle and joints of foot     Time: 9030-0923 PT Time Calculation (min) (ACUTE ONLY): 39 min  Charges:  $Gait Training: 8-22 mins $Therapeutic Exercise: 8-22 mins $Therapeutic Activity: 8-22 mins                      Van Clines, PT  Acute Rehabilitation Services Office [phone]    Levi Aland 12/31/2021, 11:20 AM

## 2021-12-31 NOTE — Progress Notes (Signed)
     Daily Progress Note Intern Pager: [phone]  Patient name: Kyle Rubio Medical record number: [id number] Date of birth: 09-13-1985 Age: 36 y.o. Gender: male  Primary Care Provider: Mechele Dawley, PA-C Consultants: None Code Status: Full  Pt Overview and Major Events to Date:  7/7 - admitted, IV vanc started  Assessment and Plan:  Quindarrius Joplin is a 36 year-old male who presented with left foot swelling and discoloration likely 2/2 cellulitis that progressed despite outpatient antibiotics. Less likely lypmhangitis and osteomyelitis given lack of findings on CT. Pertinent PMH/PSH includes T2DM, HLD.  * Cellulitis Infection appears to have extended beyond marker outline despite IV vancomycin. Will continue IV vanc for now and assess for alternate etiologies. -IV vancomycin (7/7- present) -LE doppler US to evaluate superficial thrombophlebitis, DVT -Consider ID consult if continues to worsen -Ibuprofen 600 mg Q6 prn, tylenol 650 mg Q6 prn for pain -Vitals as per floor protocol to monitor for fever -Elevate foot given edema  Diabetes (HCC) A1c 14.1. CBGs 200s-300s. Received 30u aspart over last 24 hours. Will observe further insulin needs with metformin for future insulin calculations outpatient. -Insulin aspart mSSI prn -Continue metformin 1000 mg BID with meals -Begin 70/30 insulin tomorrow given preferred insulin regimen o/p -Consider initiating SGLT-2 or GLP-1, challenging given cost -Home simvastatin 5 mg QHS -Diabetes coordinator for education  Heart murmur New heart murmur, likely S4 gallop. DDx includes long-standing HTN, aortic stenosis, hypertrophic cardiomyopathy, and CAD. He remains asymptomatic. -Monitor status for now -Consider o/p echo for further evaluation   FEN/GI: Carb-modified PPx: Lovenox Dispo:Pending PT recommendations  pending clinical improvement . Barriers include continued IV antibiotic management.   Subjective:   Justo is experiencing more foot pain this morning after doing PT yesterday. He states he noticed that the infection spread beyond the marker line around yesterday evening.  Objective: Temp:  [98 F (36.7 C)-99.1 F (37.3 C)] 98.3 F (36.8 C) (07/09 0748) Pulse Rate:  [57-60] 59 (07/09 0748) Resp:  [18] 18 (07/09 0748) BP: (100-108)/(62-74) 108/72 (07/09 0748) SpO2:  [97 %-100 %] 99 % (07/09 0748) Physical Exam: General: Sitting up in bed, standing with PT, in NAD Cardiovascular: Overall regular rate Respiratory: Breathing and speaking comfortably on RA Abdomen: Non-distended Dermatologic: Erythematous, dusky skin changes now 1-2 cm beyond marker line placed 2 days ago; warm to the touch and painful to palpation on anterolateral dorsum Extremities: Moves all extremities grossly equally, though does endorse pain with weight bearing on L foot  Laboratory: Most recent CBC Lab Results  Component Value Date   WBC 6.2 12/31/2021   HGB 11.5 (L) 12/31/2021   HCT 34.2 (L) 12/31/2021   MCV 85.3 12/31/2021   PLT 311 12/31/2021   Most recent BMP    Latest Ref Rng & Units 12/31/2021    1:43 AM  BMP  Glucose 70 - 99 mg/dL 607   BUN 6 - 20 mg/dL 15   Creatinine 3.71 - 1.24 mg/dL 0.62   Sodium 694 - 854 mmol/L 138   Potassium 3.5 - 5.1 mmol/L 4.2   Chloride 98 - 111 mmol/L 104   CO2 22 - 32 mmol/L 26   Calcium 8.9 - 10.3 mg/dL 8.8     Janeal Holmes, MD 12/31/2021, 12:47 PM PGY-1, [facility] FPTS Intern pager: [phone], text pages welcome Secure chat group [facility] Teaching Service

## 2021-12-31 NOTE — Plan of Care (Signed)
  Problem: Education: Goal: Knowledge of General Education information will improve Description: Including pain rating scale, medication(s)/side effects and non-pharmacologic comfort measures Outcome: Progressing   Problem: Clinical Measurements: Goal: Ability to maintain clinical measurements within normal limits will improve Outcome: Progressing   Problem: Activity: Goal: Risk for activity intolerance will decrease Outcome: Progressing   Problem: Nutrition: Goal: Adequate nutrition will be maintained Outcome: Progressing   Problem: Elimination: Goal: Will not experience complications related to urinary retention Outcome: Progressing   Problem: Safety: Goal: Ability to remain free from injury will improve Outcome: Progressing   Problem: Skin Integrity: Goal: Risk for impaired skin integrity will decrease Outcome: Progressing   Problem: Clinical Measurements: Goal: Ability to avoid or minimize complications of infection will improve Outcome: Progressing   Problem: Coping: Goal: Ability to adjust to condition or change in health will improve Outcome: Progressing

## 2021-12-31 NOTE — Inpatient Diabetes Management (Signed)
Inpatient Diabetes Program Recommendations  AACE/ADA: New Consensus Statement on Inpatient Glycemic Control (2015)  Target Ranges:  Prepandial:   less than 140 mg/dL      Peak postprandial:   less than 180 mg/dL (1-2 hours)      Critically ill patients:  140 - 180 mg/dL   Lab Results  Component Value Date   GLUCAP 297 (H) 12/31/2021   HGBA1C 14.1 (H) 12/30/2021    Latest Reference Range & Units 12/30/21 16:11 12/30/21 22:08 12/31/21 06:16 12/31/21 07:49  Glucose-Capillary 70 - 99 mg/dL 259 (H) 563 (H) 875 (H) 297 (H)  (H): Data is abnormally high Review of Glycemic Control  Diabetes history: type 2 Outpatient Diabetes medications: Novolin 70/30 18 units BID, (states that he was taking Novolin 70/30 30 units BID 3X per week) Current orders for Inpatient glycemic control: Novolog MODERATE correction scale TID & Novolog 0-5 units HS scale, Metformin 500 mg BID  Inpatient Diabetes Program Recommendations:   Noted that CBGs have been greater than 200 mg/dl.   Recommend starting Novolog 70/30 10 units BID to see how his blood sugars do while in the hospital. Per weight based, 68 kg X 0.2 units/kg=13.6 units. Titrate dosages as needed. Patient was not taking prescribed insulin dosages.  Patient can get Novolin Relion 70/30 insulin per vial and syringe after discharge.   Smith Mince RN BSN CDE Diabetes Coordinator Pager: [phone]  8am-5pm   Smith Mince RN BSN CDE Diabetes Coordinator Pager: [phone]  8am-5pm

## 2022-01-01 ENCOUNTER — Inpatient Hospital Stay (HOSPITAL_COMMUNITY): Payer: Self-pay

## 2022-01-01 DIAGNOSIS — M79609 Pain in unspecified limb: Secondary | ICD-10-CM

## 2022-01-01 LAB — GLUCOSE, CAPILLARY
Glucose-Capillary: 239 mg/dL — ABNORMAL HIGH (ref 70–99)
Glucose-Capillary: 300 mg/dL — ABNORMAL HIGH (ref 70–99)
Glucose-Capillary: 251 mg/dL — ABNORMAL HIGH (ref 70–99)
Glucose-Capillary: 252 mg/dL — ABNORMAL HIGH (ref 70–99)
Glucose-Capillary: 231 mg/dL — ABNORMAL HIGH (ref 70–99)

## 2022-01-01 MED ORDER — SULFAMETHOXAZOLE-TRIMETHOPRIM 800-160 MG PO TABS
1.0000 | ORAL_TABLET | Freq: Two times a day (BID) | ORAL | Status: DC
  Administered 2022-01-01 – 2022-01-02 (×2): 1 via ORAL
  Filled 2022-01-01 (×2): qty 1

## 2022-01-01 MED ORDER — INSULIN ASPART PROT & ASPART (70-30 MIX) 100 UNIT/ML ~~LOC~~ SUSP
15.0000 [IU] | Freq: Two times a day (BID) | SUBCUTANEOUS | Status: DC
Start: 2022-01-02 — End: 2022-01-02
  Administered 2022-01-02: 15 [IU] via SUBCUTANEOUS
  Filled 2022-01-01: qty 10

## 2022-01-01 NOTE — Progress Notes (Signed)
     Daily Progress Note Intern Pager: [phone]  Patient name: Kyle Rubio Medical record number: [id number] Date of birth: 1985-10-11 Age: 36 y.o. Gender: male  Primary Care Provider: Mechele Dawley, PA-C Consultants: None Code Status: Full  Pt Overview and Major Events to Date:  7/7 - admitted, vanc started  Assessment and Plan:  Kyle Rubio is a 36 year-old male who presented with left foot swelling and discoloration likely 2/2 cellulitis that progressed despite outpatient antibiotics. Less likely lypmhangitis and osteomyelitis given lack of findings on CT. Pertinent PMH/PSH includes T2DM, HLD.  * Cellulitis Continuing IV vanc given less improvement than expected. LE doppler US pending. -IV vancomycin (7/7- 7/10), continue PO bactrim 800 Q8 (7/10 - 7/15) -F/u LE doppler US to evaluate superficial thrombophlebitis, DVT -Consider ID consult if continues to worsen, potential biopsy -Ibuprofen 600 mg Q6 prn, tylenol 650 mg Q6 prn for pain -Vitals as per floor protocol to monitor for fever -Elevate foot given edema  Diabetes (HCC) A1c 14.1. CBGs 200s-300s over admission. -Continue metformin 1000 mg BID with meals -Continue 70/30 insulin today (7/10) given preferred insulin regimen o/p; stop SSI -Consider initiating SGLT-2 or GLP-1, challenging given cost -Home simvastatin 5 mg QHS -Diabetes coordinator for education  Heart murmur New heart murmur, likely S4 gallop. DDx includes long-standing HTN, aortic stenosis, hypertrophic cardiomyopathy, and CAD. He remains asymptomatic. -Monitor status for now -Consider o/p echo for further evaluation   FEN/GI: Carb-modified PPx: Lovenox Dispo:Pending PT recommendations  pending clinical improvement . Barriers include continued IV antibiotic management.   Subjective:  Doing well this morning with no complaints. Does not think area has progressed since yesterday.  Objective: Temp:  [97.9 F (36.6  C)-98.4 F (36.9 C)] 98.4 F (36.9 C) (07/10 0753) Pulse Rate:  [61-69] 69 (07/10 0753) Resp:  [18-19] 19 (07/10 0423) BP: (108-126)/(68-84) 126/84 (07/10 0753) SpO2:  [99 %-100 %] 100 % (07/10 0753) Physical Exam: General: Lying in bed with foot elevated, in NAD Cardiovascular: Regular rate Respiratory: Breathing and speaking comfortably on RA Abdomen: Non-distended Extremities: Moves all extremities grossly equally  Laboratory: Most recent CBC Lab Results  Component Value Date   WBC 6.2 12/31/2021   HGB 11.5 (L) 12/31/2021   HCT 34.2 (L) 12/31/2021   MCV 85.3 12/31/2021   PLT 311 12/31/2021   Most recent BMP    Latest Ref Rng & Units 12/31/2021    1:43 AM  BMP  Glucose 70 - 99 mg/dL 527   BUN 6 - 20 mg/dL 15   Creatinine 7.82 - 1.24 mg/dL 4.23   Sodium 536 - 144 mmol/L 138   Potassium 3.5 - 5.1 mmol/L 4.2   Chloride 98 - 111 mmol/L 104   CO2 22 - 32 mmol/L 26   Calcium 8.9 - 10.3 mg/dL 8.8     Janeal Holmes, MD 01/01/2022, 2:29 PM PGY-1, [facility] Family Medicine FPTS Intern pager: [phone], text pages welcome Secure chat group [facility] Teaching Service

## 2022-01-01 NOTE — Plan of Care (Signed)
  Problem: Education: Goal: Knowledge of General Education information will improve Description: Including pain rating scale, medication(s)/side effects and non-pharmacologic comfort measures Outcome: Progressing   Problem: Health Behavior/Discharge Planning: Goal: Ability to manage health-related needs will improve Outcome: Progressing   Problem: Nutrition: Goal: Adequate nutrition will be maintained Outcome: Progressing   Problem: Elimination: Goal: Will not experience complications related to bowel motility Outcome: Progressing   Problem: Pain Managment: Goal: General experience of comfort will improve Outcome: Progressing   Problem: Safety: Goal: Ability to remain free from injury will improve Outcome: Progressing   Problem: Skin Integrity: Goal: Risk for impaired skin integrity will decrease Outcome: Progressing   Problem: Fluid Volume: Goal: Ability to maintain a balanced intake and output will improve Outcome: Progressing

## 2022-01-01 NOTE — Progress Notes (Signed)
Lower extremity venous LT study completed.   Please see CV Proc for preliminary results.   Chelsei Mcchesney, RDMS, RVT  

## 2022-01-01 NOTE — Progress Notes (Signed)
Inpatient Diabetes Program Recommendations  AACE/ADA: New Consensus Statement on Inpatient Glycemic Control (2015)  Target Ranges:  Prepandial:   less than 140 mg/dL      Peak postprandial:   less than 180 mg/dL (1-2 hours)      Critically ill patients:  140 - 180 mg/dL   Lab Results  Component Value Date   GLUCAP 251 (H) 01/01/2022   HGBA1C 14.1 (H) 12/30/2021    Review of Glycemic Control  Diabetes history: DM2 Outpatient Diabetes medications: Novolin 70/30 30 units BID. Current orders for Inpatient glycemic control: 70/30 10 units BID, metformin 1000 mg BID  HgbA1C - 14.1%  Inpatient Diabetes Program Recommendations:    Increase 70/30 to 18 units BID Needs Novolog correction insulin while inpatient - 0-15 units TID with meals and 0-5 HS  Spoke with pt using interpreter at bedside. Discussed HgbA1C of 14.1% and importance of getting this down to around 7%. Pt was not taking insulin on a regular basis and was not taking care of himself. Discussed healthy eating, exercise and stress management and these all play a role in glucose management. Stressed importance of monitoring blood sugars at least 3x/day and taking logbook to PCP for review and possible modifications to insulin regimen. Pt's wife states pt has a PCP and she will make an appt for next week or two after discharge. Pt has meter and supplies at home. Instructed on hypoglycemia s/s and treatment. Answered all questions. Discussed importance of good glycemic control to prevent long and short-term complications. Understands that he needs tight glucose control for healing. Wife seems to be very supportive.   Pt will need prescription for Novolin 70/30 (ReliOn brand) at [facility]. Has all supplies per pt.  Thank you. Ailene Ards, RD, LDN, CDE Inpatient Diabetes Coordinator [phone]

## 2022-01-02 ENCOUNTER — Other Ambulatory Visit (HOSPITAL_COMMUNITY): Payer: Self-pay

## 2022-01-02 LAB — GLUCOSE, CAPILLARY
Glucose-Capillary: 280 mg/dL — ABNORMAL HIGH (ref 70–99)
Glucose-Capillary: 227 mg/dL — ABNORMAL HIGH (ref 70–99)

## 2022-01-02 MED ORDER — SULFAMETHOXAZOLE-TRIMETHOPRIM 800-160 MG PO TABS
1.0000 | ORAL_TABLET | Freq: Two times a day (BID) | ORAL | 0 refills | Status: DC
  Filled 2022-01-02: qty 10, 5d supply, fill #0

## 2022-01-02 MED ORDER — SULFAMETHOXAZOLE-TRIMETHOPRIM 800-160 MG PO TABS
1.0000 | ORAL_TABLET | Freq: Two times a day (BID) | ORAL | 0 refills | Status: AC
Start: 2022-01-02 — End: 2022-01-07

## 2022-01-02 MED ORDER — INSULIN ASPART PROT & ASPART (70-30 MIX) 100 UNIT/ML ~~LOC~~ SUSP
15.0000 [IU] | Freq: Two times a day (BID) | SUBCUTANEOUS | 11 refills | Status: DC
  Filled 2022-01-02: qty 10, 33d supply, fill #0

## 2022-01-02 MED ORDER — INSULIN ASPART PROT & ASPART (70-30 MIX) 100 UNIT/ML ~~LOC~~ SUSP: 15.0000 [IU] | Freq: Two times a day (BID) | SUBCUTANEOUS | 11 refills | Status: AC

## 2022-01-02 MED ORDER — ACETAMINOPHEN 325 MG PO TABS: 650.0000 mg | ORAL_TABLET | Freq: Four times a day (QID) | ORAL | 0 refills | Status: AC

## 2022-01-02 MED ORDER — METFORMIN HCL 1000 MG PO TABS: 1000.0000 mg | ORAL_TABLET | Freq: Two times a day (BID) | ORAL | 1 refills | Status: AC

## 2022-01-02 MED ORDER — METFORMIN HCL 1000 MG PO TABS
1000.0000 mg | ORAL_TABLET | Freq: Two times a day (BID) | ORAL | 1 refills | Status: DC
  Filled 2022-01-02: qty 60, 30d supply, fill #0

## 2022-01-02 NOTE — Discharge Instructions (Addendum)
Dear Kyle Rubio,  Gracias por permitirnos participar en su cuidado. Fue hospitalizado por dolor e hinchazn en el pie izquierdo y Education officer, museum. Le trataron con antibiticos. Tambin le dieron insulina y metformina para ayudar a Chief Operating Officer su diabetes.   INSTRUCCIONES DE CUIDADO Y POST-HOSPITAL 1. Contine con su antibitico bactrim. [employer] por da con 12 horas de diferencia hasta que se le acaben. 2. Asegrese de tomar su insulina y metformina segn lo recetado para asegurarse de que su diabetes est controlada. 3. Vaya a sus citas de seguimiento (enumeradas a continuacin): asegrese de llamar a su PCP y obtener una cita dentro de una semana.  -------------------------------------------------------------------------------------------------------------------------------------------------------------------  Thank you for letting us participate in your care. You were hospitalized for pain and swelling of the left foot and diagnosed with Cellulitis. You were treated with antibiotics. You were also given insulin and metformin to help control your diabetes.   POST-HOSPITAL & CARE INSTRUCTIONS Continue your bactrim antibiotic. Take two pills per day 12 hours apart until you run out. Make sure to take your insulin and metformin as prescribed to ensure your diabetes is controlled. Go to your follow up appointments (listed below): be sure to call your PCP and get an appointment within one week.  DOCTOR'S APPOINTMENT   No future appointments.  Follow-up Information     Mechele Dawley, PA-C. Schedule an appointment as soon as possible for a visit in 1 week(s).   Specialty: Physician Assistant Why: Blenda Mounts a su PCP para programar una cita de seguimiento dentro de una semana para asegurarse de que contina mejorando y para obtener sus medicamentos para la diabetes.  Call your PCP to schedule a follow up appointment within one week to ensure you are continuing  to improve and to get your diabetes medications. Contact information: [address] [place] [address] [phone]                 Take care and be well!  Family Medicine Teaching Service Inpatient Team [place]  [facility]  [address] [place], [place] [address] [phone]

## 2022-01-02 NOTE — Progress Notes (Signed)
Orthopedic Tech Progress Note Patient Details:  Kyle Rubio 1986/03/14 [id number]  Ortho Devices Type of Ortho Device: Crutches Ortho Device/Splint Interventions: Ordered, Application, Adjustment   Post Interventions Patient Tolerated: Well, Ambulated well Instructions Provided: Poper ambulation with device, Care of device  [name] 01/02/2022, 10:49 AM

## 2022-01-02 NOTE — TOC Transition Note (Signed)
Transition of Care [facility]) - CM/SW Discharge Note   Patient Details  Name: Kyle Rubio MRN: [id number] Date of Birth: [birth year]  [facility] CM/SW Contact:  Epifanio Lesches, RN Phone Number: 01/02/2022, 10:31 AM   Clinical Narrative:    Patient will DC to: home Anticipated DC date: 01/02/2022 Family notified: yes, wife Transport by: car  Cellulitis of L foot. Spanish speaking. From home with wife. PTA independent with ADL's.  PCP: Mechele Dawley, PA-C  Per MD patient ready for DC today pending lab work . RN, patient, and  patient's family aware of DC plan. Pt without insurance. Self employed . Pt states can afford Rx meds. DME crutches  will be delivered to bedside prior to d/c. Wife states will assist with care once d/c to home .  Pt with post f/u appointment noted on AVS.  Wife to provide transportation to home.Marland Kitchen  RNCM will sign off for now as intervention is no longer needed. Please consult [place] again if new needs arise.    Final next level of care: Home/Self Care Barriers to Discharge: No Barriers Identified   Patient Goals and CMS Choice        Discharge Placement                       Discharge Plan and Services                                     Social Determinants of Health (SDOH) Interventions     Readmission Risk Interventions     No data to display

## 2022-01-02 NOTE — Plan of Care (Signed)

## 2022-01-02 NOTE — Progress Notes (Signed)
Physical Therapy Treatment Patient Details Name: Kyle Rubio MRN: [id number] DOB: December 12, 1985 Today's Date: 01/02/2022   History of Present Illness Pt is a 36 y.o. male who presented 12/29/21 with worsening L foot/ankle pain/swelling after presenting to ED for the same issue 12/24/21. Pt admitted with L foot cellulitis. No evidence of ostemoyelitis or acute osseous findings with imaging. PMH: DM    PT Comments    Continuing work on functional mobility and activity tolerance;  Session focused on prgressive ambulation, stair training with crutches, and education on mobility and weight bearing progression as the infection clears; Pain in L ankle is much improved compared to previous session, but still present, and pt still does not tolerate bearing weight on the L foot; Managing with crutches well, keeping NWB today; Able to verbalize progression from 2 crutches to one crutch, to no assistive device; Kyle Rubio, Medical Interpreter present and facilitated clear communication; Questions solicited and answered; Will continue to follow acutely, but also noteworthy that pt is OK for dc home from PT standpoint     Recommendations for follow up therapy are one component of a multi-disciplinary discharge planning process, led by the attending physician.  Recommendations may be updated based on patient status, additional functional criteria and insurance authorization.  Follow Up Recommendations  No PT follow up     Assistance Recommended at Discharge PRN  Patient can return home with the following Assist for transportation;Assistance with cooking/housework   Equipment Recommendations  Crutches    Recommendations for Other Services       Precautions / Restrictions Precautions Precautions: None Precaution Comments: Very painful L ankle; proceeding WBAT on R foot Restrictions Weight Bearing Restrictions: No     Mobility  Bed Mobility Overal bed mobility: Independent                   Transfers Overall transfer level: Needs assistance Equipment used: Crutches Transfers: Sit to/from Stand Sit to Stand: Supervision           General transfer comment: Supervision for safety; Cues for crutch management    Ambulation/Gait Ambulation/Gait assistance: Supervision Gait Distance (Feet): 210 Feet Assistive device: Crutches Gait Pattern/deviations:  (3 point gait, progressing to step-through)       General Gait Details: L foot VERY painful in dependent position, and more painful when touching the floor; Opted to get up with crutches to allow for walking and keeping NWB for now to minimize pain; kept balance with crutches overall well; better tolerance of hallway ambulation   Stairs Stairs: Yes Stairs assistance: Min guard Stair Management: Two rails, One rail Left, Step to pattern, With crutches Number of Stairs: 9 (5+4x2) General stair comments: practiced going up and down steps with 2 rails, crutch and rail, and 2 crutches; demonstrated technqiues, and then pt return demonstrated well   Wheelchair Mobility    Modified Rankin (Stroke Patients Only)       Balance Overall balance assessment: No apparent balance deficits (not formally assessed)                                          Cognition Arousal/Alertness: Awake/alert Behavior During Therapy: WFL for tasks assessed/performed Overall Cognitive Status: Within Functional Limits for tasks assessed  Exercises      General Comments General comments (skin integrity, edema, etc.): L Ankle still erythematous and swollen, but swelling and AROM are overall much improved over Sunday's PT session; Discussed managing at home, and mobility/walking progression as ankle gets better; Pt able to teach back education      Pertinent Vitals/Pain Pain Assessment Pain Assessment: 0-10 Pain Score: 5  Pain Location: L foot/ankle Pain  Descriptors / Indicators: Discomfort, Grimacing, Guarding Pain Intervention(s): Monitored during session    Home Living                          Prior Function            PT Goals (current goals can now be found in the care plan section) Acute Rehab PT Goals Patient Stated Goal: to get better PT Goal Formulation: With patient/family Time For Goal Achievement: 01/06/22 Potential to Achieve Goals: Good Progress towards PT goals: Progressing toward goals (has almost met all acute PT goals)    Frequency    Min 3X/week      PT Plan Current plan remains appropriate;Equipment recommendations need to be updated    Co-evaluation              AM-PAC PT "6 Clicks" Mobility   Outcome Measure  Help needed turning from your back to your side while in a flat bed without using bedrails?: None Help needed moving from lying on your back to sitting on the side of a flat bed without using bedrails?: None Help needed moving to and from a bed to a chair (including a wheelchair)?: None Help needed standing up from a chair using your arms (e.g., wheelchair or bedside chair)?: None Help needed to walk in hospital room?: None Help needed climbing 3-5 steps with a railing? : None 6 Click Score: 24    End of Session   Activity Tolerance: Patient tolerated treatment well Patient left: in bed;with call bell/phone within reach (Dr. Erin Hearing examining pt) Nurse Communication: Mobility status PT Visit Diagnosis: Unsteadiness on feet (R26.81);Other abnormalities of gait and mobility (R26.89);Difficulty in walking, not elsewhere classified (R26.2);Pain Pain - Right/Left: Left Pain - part of body: Ankle and joints of foot     Time: 1224-4975 PT Time Calculation (min) (ACUTE ONLY): 37 min  Charges:  $Gait Training: 23-37 mins                     Roney Marion, [place] Office [place] 01/02/2022, 11:47 AM

## 2022-01-02 NOTE — Discharge Summary (Addendum)
Family Medicine Teaching [facility] Discharge Summary  Patient name: Kyle Rubio Medical record number: [id number] Date of birth: 05-07-1986 Age: 36 y.o. Gender: male Date of Admission: 12/29/2021  Date of Discharge: 01/02/2022 Admitting Physician: Carney Living, MD  Primary Care Provider: Mechele Dawley, PA-C Consultants: None  Indication for Hospitalization: Nonhealing foot swelling and pain  Brief Hospital Course:  Kyle Rubio is a 36 y.o. male with a history of type 2 diabetes mellitus, hyperlipidemia, hypertension who presented with left foot swelling and pain. Hospital course outlined by problem below:  Left ankle cellulitis Patient presented on 7/7 with progression of suspected cellulitis despite keflex and doxycycline administration. VSS, WBC normal, imaging unremarkable. He was given IV vancomycin d/t failure of outpatient therapy. Lesion extended beyond wound markings despite 48 hours of vanc. LE doppler US ordered which was unremarkable other than enlarged inguinal lymph nodes consistent with acute LE infection. Wound borders stopped extending after 72 hours of vanc. Vanc was switched to PO bactrim at discharge, at which point patient was stable with improving cellulitis and was ambulating better with crutches. Discharged with close PCP follow up.   Diabetes mellitus Patient's glucose on admission 258. A1c 14.1. Lipid panel WNL. He had started taking prescribed insulin 70/30 18u BID and metformin 500 mg BID two days before admission after 2 year loss to follow up. CBGs were monitored over admission and ranged 200s-300s. Over admission, he was titrated to 15u 70/30 insulin with 1000 mg metformin BID and given instructions to f/u regimen. Recommend close PCP follow up.   PCP follow-up items: Continued cellulitis improvement on abx. Patient to finish course of bactrim as prescribed. Skin marker boundary surrounding cellulitis should still  be in place at follow up to aid assessment.  Diabetes regimen given difficult follow up. Discharged on 15 units 70/30 insulin and 1000 mg metformin BID.   Discharge Diagnoses/Problem List:  Principal Problem for Admission: Cellulitis Other Problems addressed during stay:  -T2DM  Disposition: Home  Discharge Condition: Stable  Discharge Exam:  Blood pressure 124/75, pulse 64, temperature 98 F (36.7 C), resp. rate 18, height 5\' 6"  (1.676 m), weight 68 kg, SpO2 100 %.  GEN: Lying in bed, in NAD, conversant CAR: Regular rate RES: Breathing and speaking comfortably on RA ABD: Non-distended PSY: Alert and oriented, euthymic mood and affect EXT: Left foot elevated in bed, dusky erythema with stable borders, TTP on anterolateral dorsum  Significant Procedures: None  Significant Labs and Imaging:     Latest Ref Rng & Units 12/31/2021    1:43 AM 12/29/2021    6:52 AM 12/24/2021    3:08 PM  CBC  WBC 4.0 - 10.5 K/uL 6.2  7.3  8.3   Hemoglobin 13.0 - 17.0 g/dL 02/24/2022  81.2  75.1   Hematocrit 39.0 - 52.0 % 34.2  41.1  41.3   Platelets 150 - 400 K/uL 311  337  261        Latest Ref Rng & Units 12/31/2021    1:43 AM 12/29/2021    6:52 AM 12/24/2021    3:08 PM  BMP  Glucose 70 - 99 mg/dL 02/24/2022  174  944   BUN 6 - 20 mg/dL 15  14  12    Creatinine 0.61 - 1.24 mg/dL 967   5.91   Sodium 135 - 145 mmol/L 138  139  133   Potassium 3.5 - 5.1 mmol/L 4.2  4.6  4.9   Chloride 98 - 111 mmol/L 104  102  96   CO2 22 - 32 mmol/L 26  25  24    Calcium 8.9 - 10.3 mg/dL 8.8  9.0  9.2     XR L Foot IMPRESSION: Negative.  XR L Ankle IMPRESSION: No acute osseous abnormality and no radiopaque foreign body identified.  XR Complete L Foot IMPRESSION: No acute osseous abnormality and no radiopaque foreign body identified.  CT L Ankle with Contrast IMPRESSION: 1. Soft tissue swelling around the ankle without focal fluid collection suspicious for soft tissue infection (cellulitis); correlate  clinically. No evidence of abscess. 2. No evidence of osteomyelitis or septic arthritis. 3. No acute osseous findings.  LE doppler of L lower extremity Summary:  RIGHT:  - No evidence of common femoral vein obstruction.  LEFT:  - There is no evidence of deep vein thrombosis in the lower extremity.  - No cystic structure found in the popliteal fossa.  - Ultrasound characteristics of enlarged lymph nodes noted in the groin.     Results/Tests Pending at Time of Discharge: Final LE US doppler results (although expected to be same as preliminary results)  Discharge Medications:  Allergies as of 01/02/2022   No Known Allergies      Medication List     STOP taking these medications    cephALEXin 500 MG capsule Commonly known as: KEFLEX   doxycycline 100 MG capsule Commonly known as: VIBRAMYCIN   furosemide 20 MG tablet Commonly known as: LASIX       TAKE these medications    acetaminophen 325 MG tablet Commonly known as: TYLENOL Take 2 tablets (650 mg total) by mouth every 6 (six) hours.   ibuprofen 600 MG tablet Commonly known as: ADVIL Take 1 tablet (600 mg total) by mouth every 6 (six) hours as needed for mild pain or moderate pain.   insulin aspart protamine- aspart (70-30) 100 UNIT/ML injection Commonly known as: NOVOLOG MIX 70/30 Inject 0.15 mLs (15 Units total) into the skin 2 (two) times daily with a meal. What changed: how much to take   metFORMIN 1000 MG tablet Commonly known as: GLUCOPHAGE Take 1 tablet (1,000 mg total) by mouth 2 (two) times daily with a meal. What changed:  medication strength how much to take   simvastatin 5 MG tablet Commonly known as: Zocor Take 1 tablet (5 mg total) by mouth at bedtime.   sulfamethoxazole-trimethoprim 800-160 MG tablet Commonly known as: BACTRIM DS Take 1 tablet by mouth every 12 (twelve) hours for 5 days.               Durable Medical Equipment  (From admission, onward)           Start      Ordered   01/02/22 1036  For home use only DME Crutches  Once        01/02/22 1035           Discharge Instructions: Please refer to Patient Instructions section of EMR for full details.  Patient was counseled important signs and symptoms that should prompt return to medical care, changes in medications, dietary instructions, activity restrictions, and follow up appointments.   Follow-Up Appointments:  Follow-up Information     03/05/22, PA-C. Schedule an appointment as soon as possible for a visit in 1 week(s).   Specialty: Physician Assistant Why: Mechele Dawley a su PCP para programar una cita de seguimiento dentro de una semana para asegurarse de que contina mejorando y para obtener sus medicamentos para la diabetes.  Call  your PCP to schedule a follow up appointment within one week to ensure you are continuing to improve and to get your diabetes medications. Contact information: [address] [place] [address] [phone]                Janeal Holmes, MD 01/02/2022, 12:12 PM PGY-1, [facility]   Upper Level Addendum: I have seen and evaluated this patient along with Dr. Phineas Real and reviewed the above note, making necessary revisions as appropriate. I agree with the medical decision making and physical exam as noted above. Fayette Pho, MD PGY-3 [facility] Family Medicine Residency

## 2022-01-08 ENCOUNTER — Telehealth: Payer: Self-pay | Admitting: Family Medicine

## 2022-01-08 NOTE — Telephone Encounter (Signed)
Called and discussed with patient and his wife He is feeling better The swelling and redness is much improved Walking a little but still hurting when he does  Finished his antibiotics on Saturday Has appointment with PCP tomorrow Recommend he try to walk a little more each day and to ask his PCP any questions They were appreciative

## 2022-03-23 ENCOUNTER — Emergency Department (HOSPITAL_COMMUNITY)
Admission: EM | Admit: 2022-03-23 | Discharge: 2022-03-24 | Disposition: A | Discharge status: Home / Self Care | Payer: Self-pay | Attending: Emergency Medicine | Admitting: Emergency Medicine

## 2022-03-23 ENCOUNTER — Emergency Department (HOSPITAL_COMMUNITY): Payer: Self-pay

## 2022-03-23 ENCOUNTER — Encounter (HOSPITAL_COMMUNITY): Payer: Self-pay | Admitting: Emergency Medicine

## 2022-03-23 ENCOUNTER — Other Ambulatory Visit: Payer: Self-pay

## 2022-03-23 DIAGNOSIS — E1140 Type 2 diabetes mellitus with diabetic neuropathy, unspecified: Secondary | ICD-10-CM | POA: Insufficient documentation

## 2022-03-23 DIAGNOSIS — E1142 Type 2 diabetes mellitus with diabetic polyneuropathy: Secondary | ICD-10-CM

## 2022-03-23 DIAGNOSIS — Z794 Long term (current) use of insulin: Secondary | ICD-10-CM | POA: Insufficient documentation

## 2022-03-23 DIAGNOSIS — R208 Other disturbances of skin sensation: Secondary | ICD-10-CM | POA: Insufficient documentation

## 2022-03-23 LAB — CBC WITH DIFFERENTIAL/PLATELET
Abs Immature Granulocytes: 0.01 10*3/uL (ref 0.00–0.07)
Basophils Absolute: 0 10*3/uL (ref 0.0–0.1)
Basophils Relative: 0 %
Eosinophils Absolute: 0.1 10*3/uL (ref 0.0–0.5)
Eosinophils Relative: 1 %
HCT: 40 % (ref 39.0–52.0)
Hemoglobin: 13.8 g/dL (ref 13.0–17.0)
Immature Granulocytes: 0 %
Lymphocytes Relative: 37 %
Lymphs Abs: 2.2 10*3/uL (ref 0.7–4.0)
MCH: 27.8 pg (ref 26.0–34.0)
MCHC: 34.5 g/dL (ref 30.0–36.0)
MCV: 80.5 fL (ref 80.0–100.0)
Monocytes Absolute: 0.5 10*3/uL (ref 0.1–1.0)
Monocytes Relative: 9 %
Neutro Abs: 3.1 10*3/uL (ref 1.7–7.7)
Neutrophils Relative %: 53 %
Platelets: 399 10*3/uL (ref 150–400)
RBC: 4.97 MIL/uL (ref 4.22–5.81)
RDW: 12 % (ref 11.5–15.5)
WBC: 5.9 10*3/uL (ref 4.0–10.5)
nRBC: 0 % (ref 0.0–0.2)

## 2022-03-23 LAB — BASIC METABOLIC PANEL
Anion gap: 9 (ref 5–15)
BUN: 16 mg/dL (ref 6–20)
CO2: 25 mmol/L (ref 22–32)
Calcium: 9.7 mg/dL (ref 8.9–10.3)
Chloride: 104 mmol/L (ref 98–111)
Creatinine, Ser: 0.58 mg/dL — ABNORMAL LOW (ref 0.61–1.24)
GFR, Estimated: >60 mL/min (ref >60)
Glucose, Bld: 92 mg/dL (ref 70–99)
Potassium: 4.6 mmol/L (ref 3.5–5.1)
Sodium: 138 mmol/L (ref 135–145)

## 2022-03-23 LAB — HEMOGLOBIN A1C
Hgb A1c MFr Bld: 6.8 % — ABNORMAL HIGH (ref 4.8–5.6)
Mean Plasma Glucose: 148.46 mg/dL

## 2022-03-23 NOTE — ED Provider Triage Note (Signed)
Emergency Medicine Provider Triage Evaluation Note  Romeo Zielinski , a 36 y.o. male  was evaluated in triage.  Pt complains of burning sensation from his mid thoracic back down to his feet.  Type I diabetic, no weakness.  Seen for the same in the past..  Review of Systems  Positive: Burning pain Negative: Weakness or numbness  Physical Exam  BP 131/88 (BP Location: Right Arm)   Pulse 100   Temp 99 F (37.2 C) (Oral)   Resp 14   SpO2 100%  Gen:   Awake, no distress   Resp:  Normal effort  MSK:   Moves extremities without difficulty  Other:  Normal strength in the lower extremities  Medical Decision Making  Medically screening exam initiated at 2:17 PM.  Appropriate orders placed.  Ethridge Sollenberger was informed that the remainder of the evaluation will be completed by another provider, this initial triage assessment does not replace that evaluation, and the importance of remaining in the ED until their evaluation is complete.  Work-up initiated.  Denies fever, soaking night sweats, unexplained weight loss.   Margarita Mail, PA-C 03/23/22 1419

## 2022-03-23 NOTE — ED Triage Notes (Signed)
C/o burning pain to thoracic back that radiates down bilateral legs x 3 months.  Discoloration to L ankle that he states has been there since he was last seen in ED 3 months ago for a L foot infection. Denies injury.   Denies urinary incontinence.

## 2022-03-24 MED ORDER — GABAPENTIN 300 MG PO CAPS: 300.0000 mg | ORAL_CAPSULE | Freq: Every day | ORAL | 0 refills | Status: AC

## 2022-03-24 NOTE — ED Provider Notes (Signed)
[facility] EMERGENCY DEPARTMENT Provider Note   CSN: [id number] Arrival date & time: 03/23/22  1346     History  No chief complaint on file.   Kyle Rubio is a 36 y.o. male.  HPI   Patient is a 36 year old male history of type 2 diabetes, DKA, diabetic neuropathy presents to the emergency department for evaluation of burning sensation on his legs.  Patient states he had a cellulitis on his left foot in July which caused pain around the wound and and he started to have burning sensation all over his legs.  The burning sensation got worse in the last week to the point where it is very difficult for him to put his clothes on.  States he has also developed burning sensation on his lower back.  States the burning sensation is consistent throughout the day but got worse before patient got to bed.  Patient states he was seen by his PCP 3 weeks ago and was put on gabapentin 200 mg daily for diabetic neuropathy.  There was no improvement in fact the burning sensation even got worse.  States his PCP told him that they can increase gabapentin doses if it does not help.  Patient states his blood sugar and A1c have been well controlled.  Denies urinary symptoms, saddle paresthesia, chest pain, shortness of breath, nausea, vomiting, constipation, diarrhea, incontinence, rash.  Home Medications Prior to Admission medications   Medication Sig Start Date End Date Taking? Authorizing Provider  gabapentin (NEURONTIN) 300 MG capsule Take 1 capsule (300 mg total) by mouth daily for 10 days. 03/24/22 04/03/22 Yes Rex Kras, PA  acetaminophen (TYLENOL) 325 MG tablet Take 2 tablets (650 mg total) by mouth every 6 (six) hours. 01/02/22   Ezequiel Essex, MD  ibuprofen (ADVIL) 600 MG tablet Take 1 tablet (600 mg total) by mouth every 6 (six) hours as needed for mild pain or moderate pain. Patient not taking: Reported on 12/29/2021 03/24/19   Antonietta Breach, PA-C  insulin aspart protamine-  aspart (NOVOLOG MIX 70/30) (70-30) 100 UNIT/ML injection Inject 0.15 mLs (15 Units total) into the skin 2 (two) times daily with a meal. 01/02/22   Jacelyn Grip, MD  metFORMIN (GLUCOPHAGE) 1000 MG tablet Take 1 tablet (1,000 mg total) by mouth 2 (two) times daily with a meal. 01/02/22   Jacelyn Grip, MD  simvastatin (ZOCOR) 5 MG tablet Take 1 tablet (5 mg total) by mouth at bedtime. Patient not taking: Reported on 12/29/2021 09/21/11 09/20/12  Jonetta Osgood, MD      Allergies    Patient has no known allergies.    Review of Systems   Review of Systems  Neurological:        Burning sensation bilateral legs    Physical Exam Updated Vital Signs BP 130/81 (BP Location: Left Arm)   Pulse 87   Temp 98.8 F (37.1 C) (Oral)   Resp 17   SpO2 100%  Physical Exam Vitals and nursing note reviewed.  Constitutional:      Appearance: Normal appearance.  HENT:     Head: Normocephalic and atraumatic.     Mouth/Throat:     Mouth: Mucous membranes are moist.  Eyes:     General: No scleral icterus. Cardiovascular:     Rate and Rhythm: Normal rate and regular rhythm.     Pulses: Normal pulses.     Heart sounds: Normal heart sounds.  Pulmonary:     Effort: Pulmonary effort is normal.  Breath sounds: Normal breath sounds.  Abdominal:     General: Abdomen is flat.     Palpations: Abdomen is soft.     Tenderness: There is no abdominal tenderness.  Musculoskeletal:        General: No deformity.  Skin:    General: Skin is warm.     Findings: No rash.     Comments: Healed cellulitis wound on the lateral left ankle.  Neurological:     General: No focal deficit present.     Mental Status: He is alert.   Neurological:     Mental Status: Pt is alert.     Comments: Alert and oriented to self, place, time and event.  Speech is fluent, clear without dysarthria or dysphasia.  Strength symmetric in upper/lower extremities   Sensation intact in upper/lower extremities  No pronator drift.   Normal finger-to-nose and feet tapping.  CN I not tested  CN II grossly intact visual fields bilaterally. Did not visualize posterior eye.  CN III, IV, VI PERRLA and EOMs intact bilaterally  CN V Intact sensation to sharp and light touch to the face  CN VII facial movements symmetric  CN VIII not tested  CN IX, X no uvula deviation, symmetric rise of soft palate  CN XI 5/5 SCM and trapezius strength bilaterally  CN XII Midline tongue protrusion, symmetric L/R movements    ED Results / Procedures / Treatments   Labs (all labs ordered are listed, but only abnormal results are displayed) Labs Reviewed  BASIC METABOLIC PANEL - Abnormal; Notable for the following components:      Result Value   Creatinine, Ser 0.58 (*)    All other components within normal limits  HEMOGLOBIN A1C - Abnormal; Notable for the following components:   Hgb A1c MFr Bld 6.8 (*)    All other components within normal limits  CBC WITH DIFFERENTIAL/PLATELET  CBG MONITORING, ED    EKG None  Radiology DG Thoracic Spine 2 View  Result Date: 03/23/2022 CLINICAL DATA:  Three months of burning back pain which radiates down both legs. EXAM: THORACIC SPINE 2 VIEWS COMPARISON:  None Available. FINDINGS: There is no evidence of thoracic spine fracture. Alignment is normal. No other significant bone abnormalities are identified. Visualized lung fields are clear. IMPRESSION: No acute osseous abnormality. Electronically Signed   By: Dahlia Bailiff M.D.   On: 03/23/2022 15:11   DG Lumbar Spine Complete  Result Date: 03/23/2022 CLINICAL DATA:  Three months of burning back pain which radiates down both legs. EXAM: LUMBAR SPINE - COMPLETE 4+ VIEW COMPARISON:  None Available. FINDINGS: There is no evidence of lumbar spine fracture. Alignment is normal. Intervertebral disc spaces are maintained. IMPRESSION: No acute osseous abnormality. Electronically Signed   By: Dahlia Bailiff M.D.   On: 03/23/2022 15:10     Procedures Procedures    Medications Ordered in ED Medications - No data to display  ED Course/ Medical Decision Making/ A&P                           Medical Decision Making  This patient presents to the ED for concern of burning sensation on bilateral legs, this involves an extensive number of treatment options, and is a complaint that carries with it a high risk of complications and morbidity.  The differential diagnosis includes diabetic neuropathy, radiculopathy, musculoskeletal pain.   Co morbidities that complicate the patient evaluation  See HPI   Additional history  obtained:  Additional history obtained from EMR External records from outside source obtained and reviewed including Care Everywhere/External Records and Primary Care Documents  Lab Tests:  I Ordered, and personally interpreted labs.  The pertinent results include:  No leukocytosis noted.  No evidence of anemia.  Platelets within normal range.  No electrolyte abnormalities noted.  Renal function within normal limits.  No transaminitis noted.  Hemoglobin A1c 6.8 Imaging Studies ordered:  I ordered imaging studies including x-ray thoracic spine and x-ray lumbar spine I independently visualized and interpreted imaging which showed No acute osseous abnormality I agree with the radiologist interpretation   Cardiac Monitoring: / EKG:  The patient was maintained on a cardiac monitor.  I personally viewed and interpreted the cardiac monitored which showed an underlying rhythm of: sinus rhythm   Consultations Obtained:  na   Problem List / ED Course / Critical interventions / Medication management  Diabetic neuropathy Vitals signs within normal range and stable throughout visit. Laboratory/imaging studies significant for: See above On physical examination, patient is afebrile and appears in no acute distress.  Neuro exam was benign with strength symmetric in upper/lower extremities and sensation intact  in upper/lower extremities.  He does have healed cellulitis wound on the left ankle.  She has been recently put on gabapentin 200 mg/day with no improvement, in fact bone and station has increased in the last month.  Patient was told by his PCP that they can increase gabapentin.  Given benign labs and imaging.  This is likely to be due to diabetic neuropathy.  I will increase gabapentin to 300 mg daily.  Patient will follow-up with his PCP Alberteen Spindle next week on Thursday for any medication modification/changes. Recommended follow up with neurology for further evaluation. I have reviewed the patients home medicines and have made adjustments as needed    Social Determinants of Health:  na   Test / Admission - Considered:  Continued outpatient therapy with the same. Follow-up with PCP and neurology recommended for reevaluation of symptoms. Treatment plan discussed with patient.  Pt acknowledged understanding was agreeable to the plan. Worrisome signs and symptoms were discussed with patient, and patient acknowledged understanding to return to the ED if they noticed these signs and symptoms. Patient was stable upon discharge.          Final Clinical Impression(s) / ED Diagnoses Final diagnoses:  None    Rx / DC Orders ED Discharge Orders          Ordered    gabapentin (NEURONTIN) 300 MG capsule  Daily        03/24/22 0519    Ambulatory referral to Neurology       Comments: An appointment is requested in approximately: 1 week   03/24/22 0601              Rex Kras, PA 03/24/22 4098    Kyle Reichert, MD 03/29/22 1000

## 2022-03-24 NOTE — ED Notes (Signed)
Patient denies pain and is resting comfortably.  

## 2022-03-24 NOTE — Discharge Instructions (Addendum)
Sus trabajos de laboratorio y sus imgenes fueron tranquilizadores hoy. Haga un seguimiento con su PCP Alberteen Spindle, PA-C para la modificacin de medicamentos para la neuropata diabtica. Llame a neurologa para una cita de seguimiento. No dude en regresar al departamento de emergencias si los sntomas preocupantes que discutimos se vuelven evidentes.

## 2022-03-24 NOTE — ED Notes (Signed)
ED Provider at bedside.
